# Patient Record
Sex: Male | Born: 1950
Health system: Southern US, Community
[De-identification: ages and names within clinical notes are randomized; demographics above are authoritative.]

## PROBLEM LIST (undated history)

## (undated) DIAGNOSIS — E119 Type 2 diabetes mellitus without complications: Secondary | ICD-10-CM

## (undated) DIAGNOSIS — I10 Essential (primary) hypertension: Secondary | ICD-10-CM

## (undated) DIAGNOSIS — M549 Dorsalgia, unspecified: Secondary | ICD-10-CM

## (undated) DIAGNOSIS — E785 Hyperlipidemia, unspecified: Secondary | ICD-10-CM

## (undated) DIAGNOSIS — H269 Unspecified cataract: Secondary | ICD-10-CM

## (undated) HISTORY — PX: MINOR CARPAL TUNNEL: SHX6472

## (undated) HISTORY — PX: EYE SURGERY: SHX253

## (undated) HISTORY — DX: Unspecified cataract: H26.9

## (undated) HISTORY — DX: Type 2 diabetes mellitus without complications: E11.9

## (undated) HISTORY — DX: Hyperlipidemia, unspecified: E78.5

## (undated) HISTORY — PX: POLYPECTOMY: SHX149

## (undated) HISTORY — DX: Dorsalgia, unspecified: M54.9

## (undated) HISTORY — PX: KNEE SURGERY: SHX244

## (undated) HISTORY — DX: Essential (primary) hypertension: I10

## (undated) HISTORY — PX: TONSILLECTOMY: SUR1361

---

## 1998-03-02 ENCOUNTER — Encounter: Admission: RE | Admit: 1998-03-02 | Discharge: 1998-05-31 | Payer: Self-pay

## 1998-10-10 ENCOUNTER — Encounter: Admission: RE | Admit: 1998-10-10 | Discharge: 1999-01-08 | Payer: Self-pay | Admitting: Orthopaedic Surgery

## 1999-05-04 ENCOUNTER — Emergency Department (HOSPITAL_COMMUNITY): Admission: EM | Admit: 1999-05-04 | Discharge: 1999-05-04 | Payer: Self-pay | Admitting: Emergency Medicine

## 1999-05-11 ENCOUNTER — Emergency Department (HOSPITAL_COMMUNITY): Admission: EM | Admit: 1999-05-11 | Discharge: 1999-05-11 | Payer: Self-pay | Admitting: Emergency Medicine

## 2002-01-25 ENCOUNTER — Encounter: Payer: Self-pay | Admitting: Family Medicine

## 2002-01-25 ENCOUNTER — Encounter: Admission: RE | Admit: 2002-01-25 | Discharge: 2002-01-25 | Payer: Self-pay | Admitting: Family Medicine

## 2003-02-02 ENCOUNTER — Ambulatory Visit (HOSPITAL_COMMUNITY): Admission: RE | Admit: 2003-02-02 | Discharge: 2003-02-02 | Payer: Self-pay | Admitting: Family Medicine

## 2003-02-02 ENCOUNTER — Encounter: Payer: Self-pay | Admitting: Family Medicine

## 2003-12-07 ENCOUNTER — Ambulatory Visit (HOSPITAL_COMMUNITY): Admission: RE | Admit: 2003-12-07 | Discharge: 2003-12-07 | Payer: Self-pay | Admitting: Family Medicine

## 2005-05-24 ENCOUNTER — Encounter: Admission: RE | Admit: 2005-05-24 | Discharge: 2005-05-24 | Payer: Self-pay | Admitting: Family Medicine

## 2005-05-24 IMAGING — CR DG CHEST 2V
1 series · 1 of 1 positions shown · non-contrast
Comparison: None

CLINICAL DATA: Hypertension

CHEST - 2 VIEW:

[w chest pa]
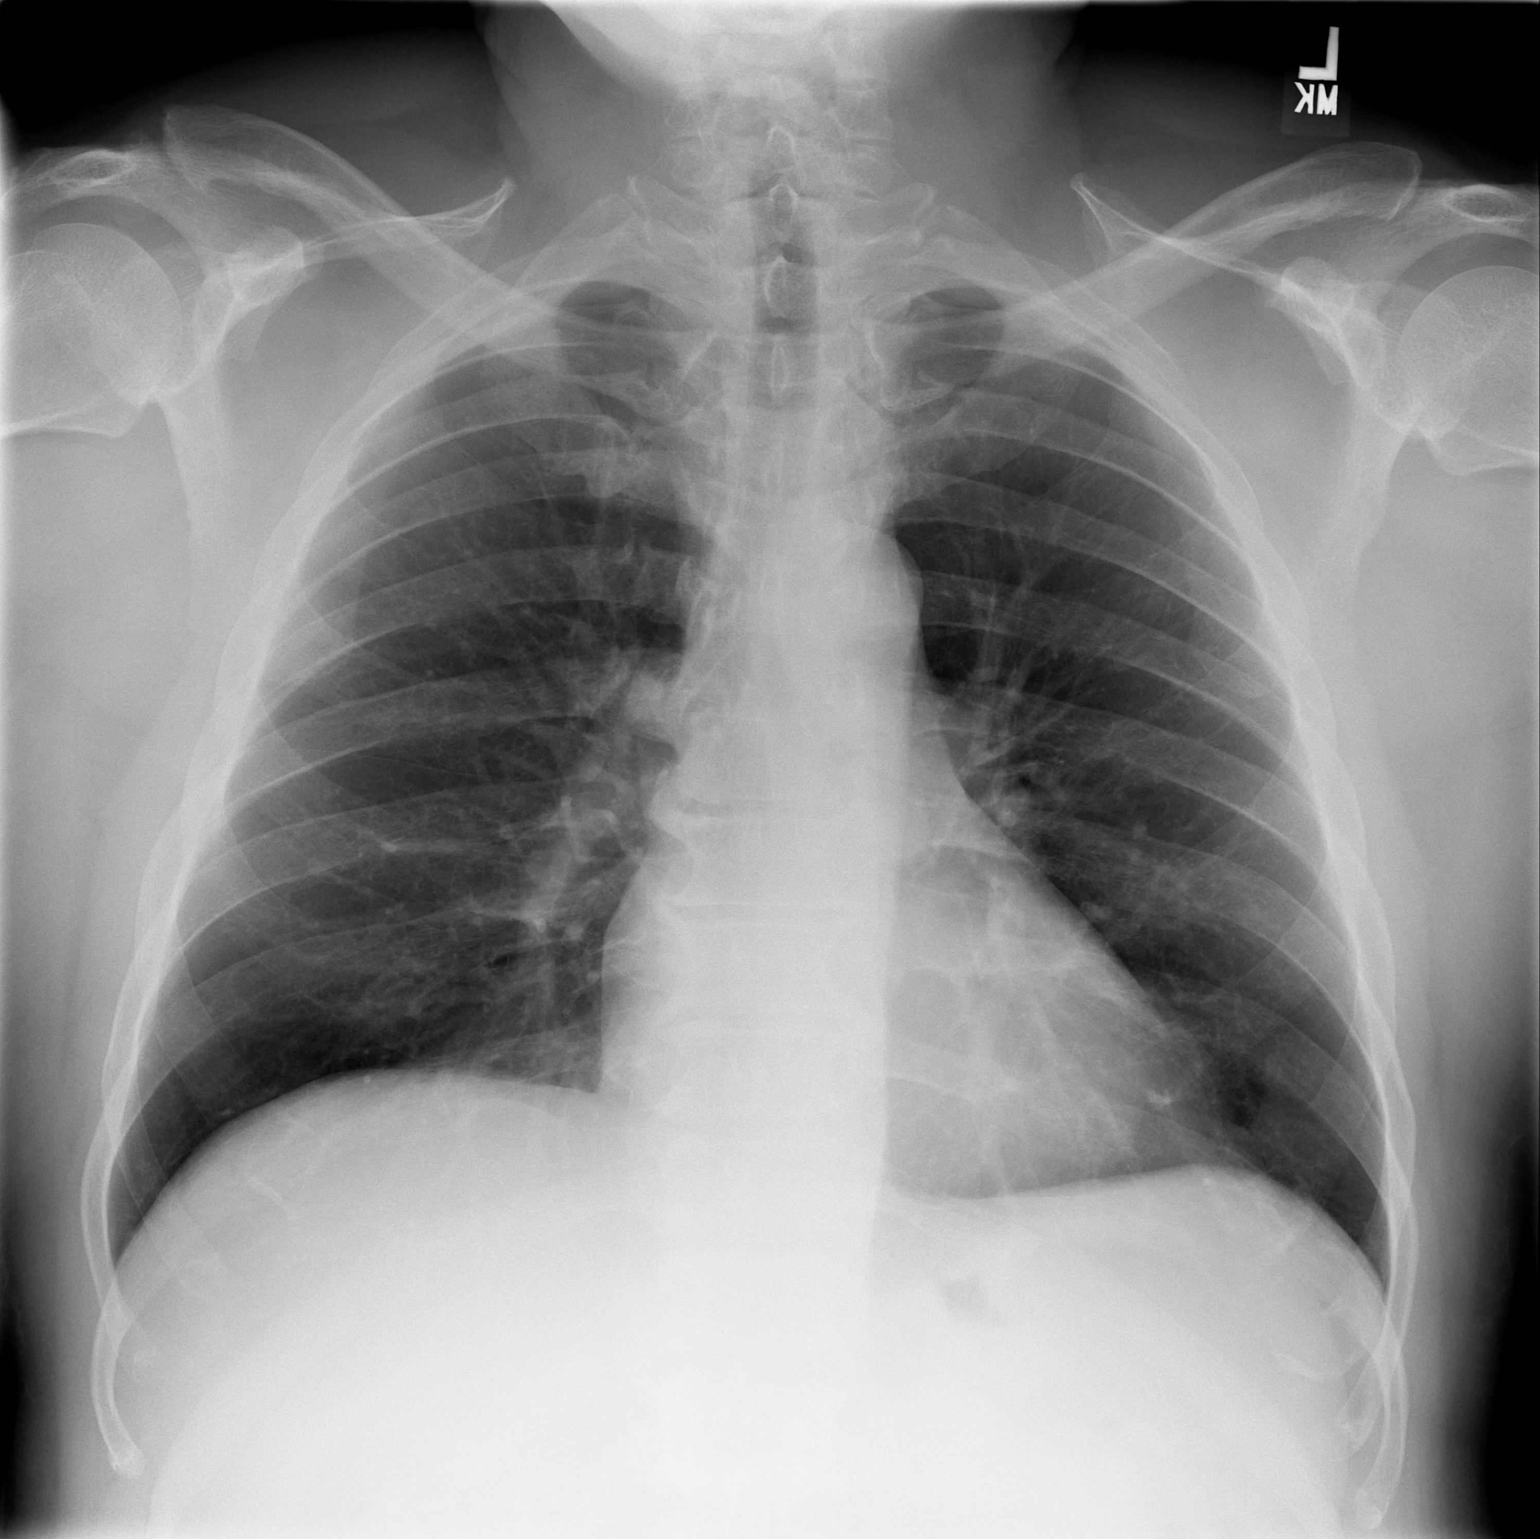

[1 of 1 positions shown; findings below may reference images not displayed]

FINDINGS: The heart size and mediastinal contours are within normal limits. 
Both lungs are clear.  The visualized skeletal structures are unremarkable.
IMPRESSION: No active cardiopulmonary disease

## 2008-10-31 ENCOUNTER — Encounter: Admission: RE | Admit: 2008-10-31 | Discharge: 2008-10-31 | Payer: Self-pay | Admitting: Family Medicine

## 2008-10-31 ENCOUNTER — Ambulatory Visit: Payer: Self-pay | Admitting: Gastroenterology

## 2008-10-31 IMAGING — CR DG CHEST 2V
2 series · 2 of 2 positions shown · non-contrast
Comparison: Chest x-ray of [DATE]

CLINICAL DATA: Hypertension by history

CHEST - 2 VIEW

[w chest pa]
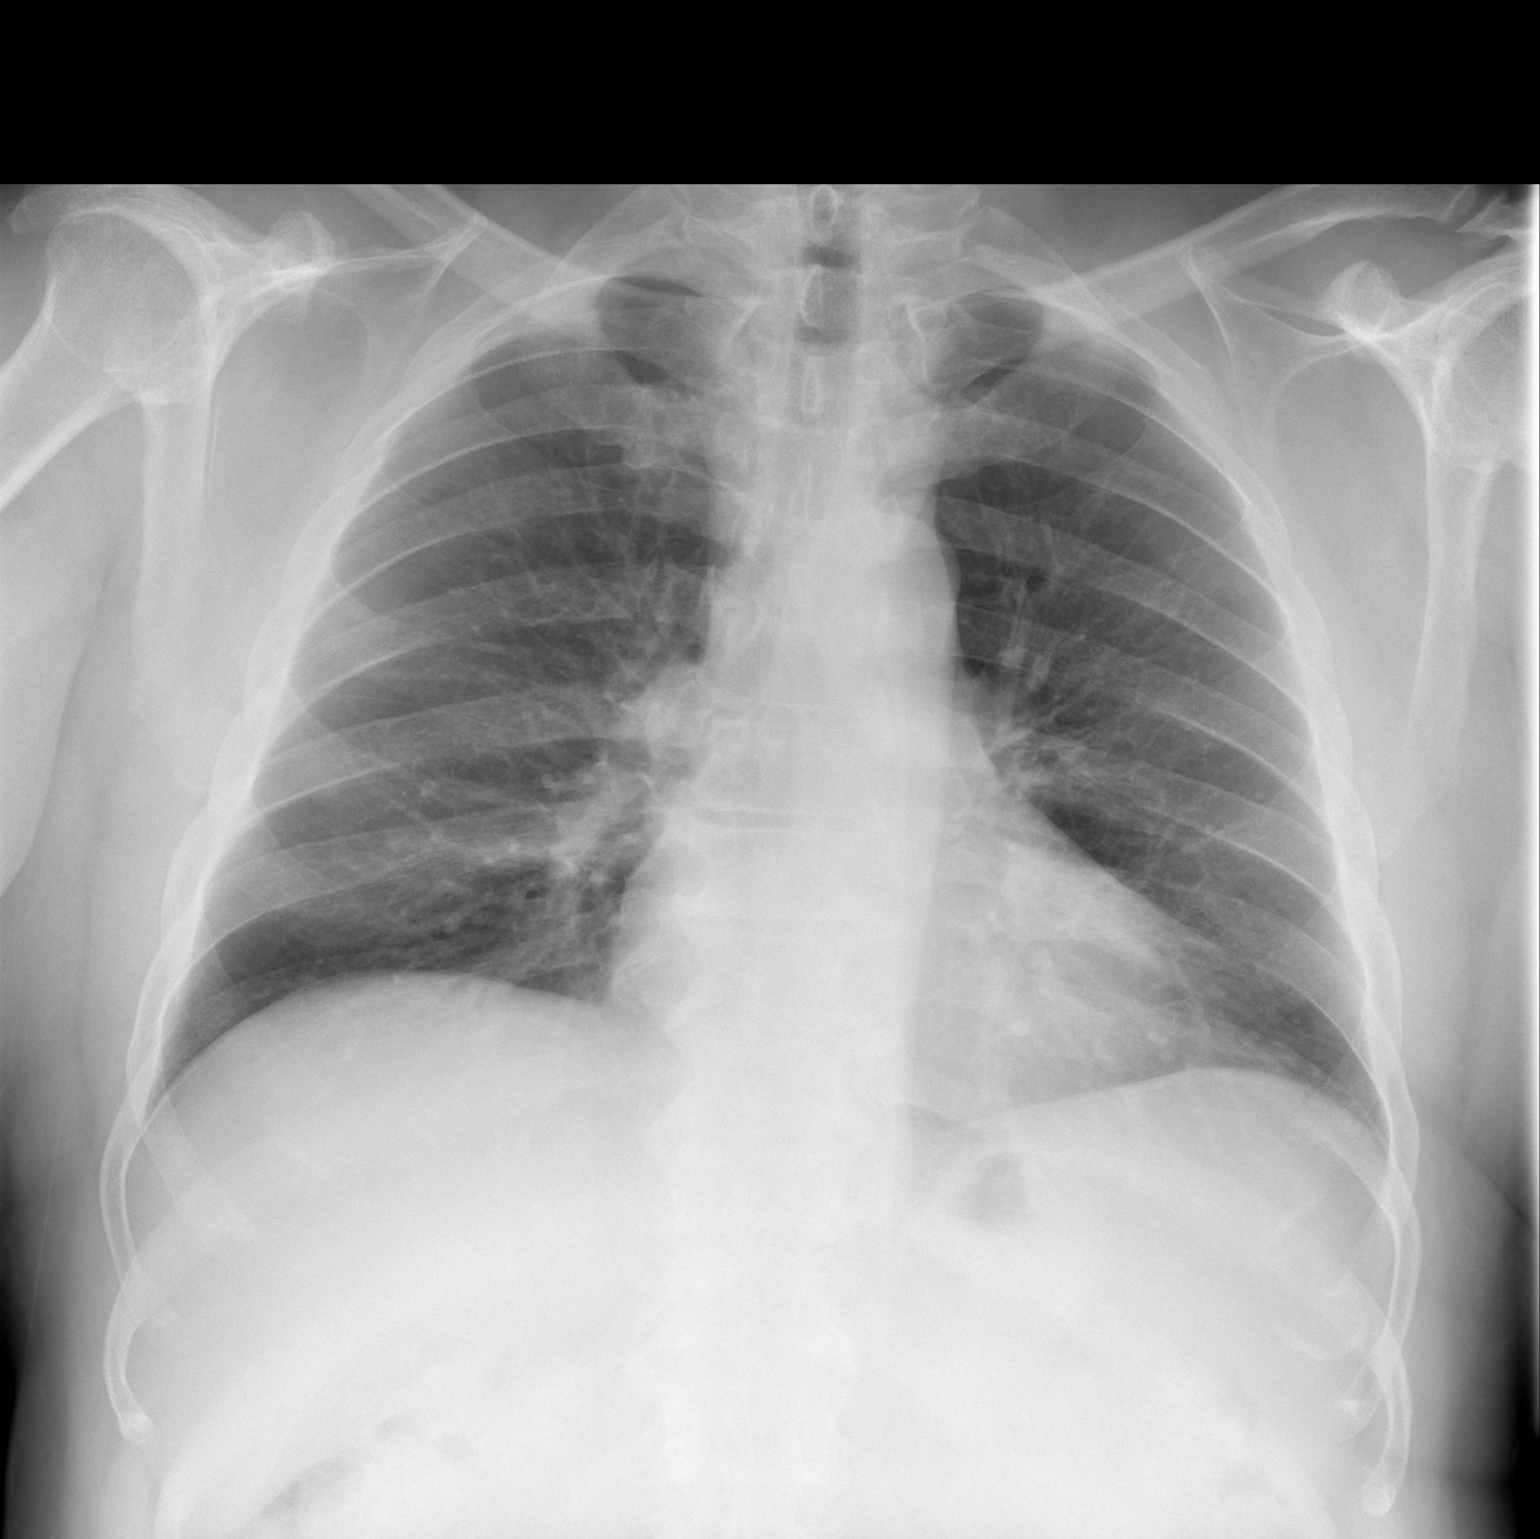

[w chest lat]
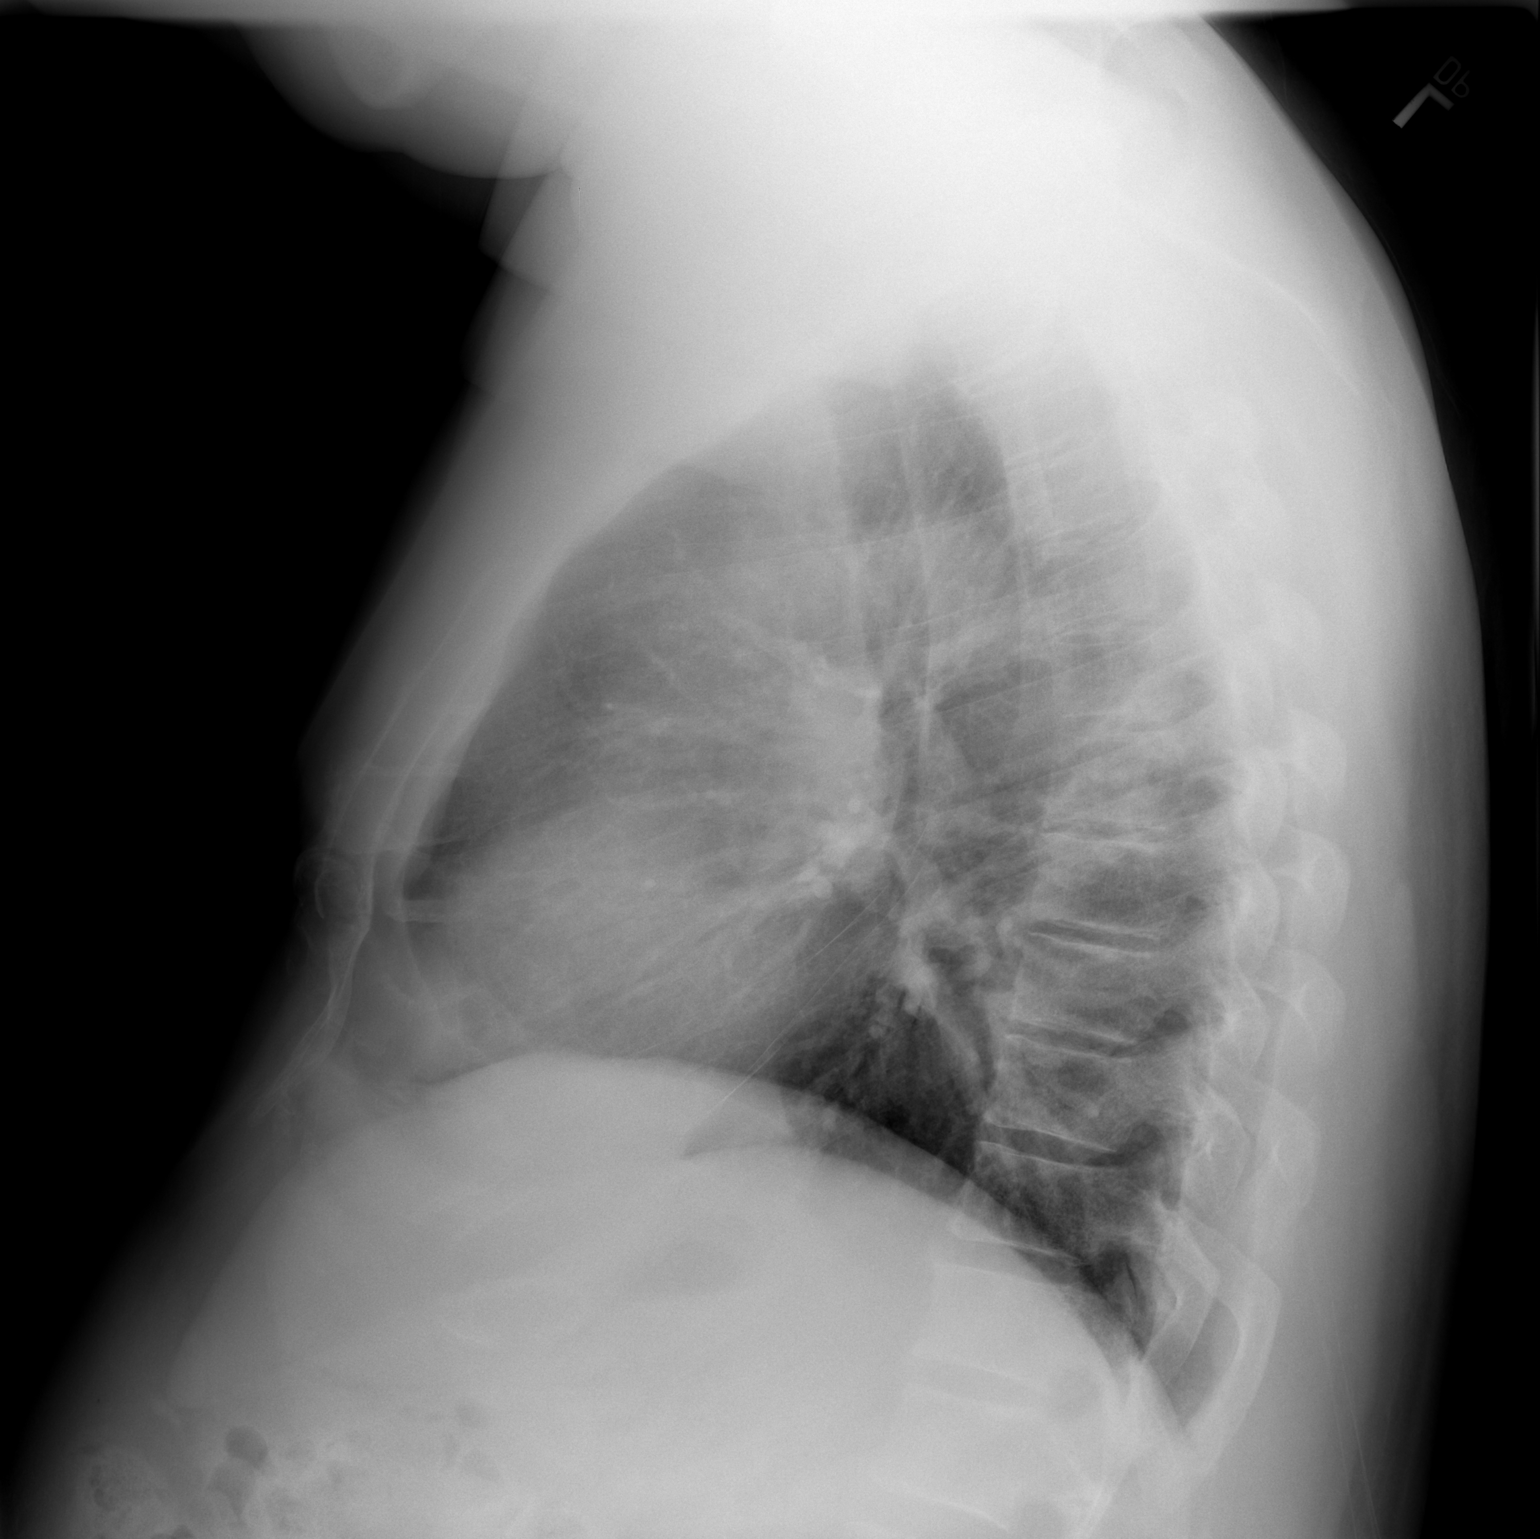

[2 of 2 positions shown; findings below may reference images not displayed]

FINDINGS: The lungs are clear.  The heart is within normal limits
in size.  No bony abnormality is seen.
IMPRESSION: No active lung disease.

## 2008-11-14 ENCOUNTER — Ambulatory Visit: Payer: Self-pay | Admitting: Gastroenterology

## 2008-11-14 ENCOUNTER — Encounter: Payer: Self-pay | Admitting: Gastroenterology

## 2008-11-15 ENCOUNTER — Encounter: Payer: Self-pay | Admitting: Gastroenterology

## 2009-03-10 ENCOUNTER — Emergency Department (HOSPITAL_COMMUNITY): Admission: EM | Admit: 2009-03-10 | Discharge: 2009-03-10 | Payer: Self-pay | Admitting: Emergency Medicine

## 2009-03-10 IMAGING — CT CT CERVICAL SPINE W/O CM
3 of 4 series · 13 of 27 positions shown, 15 images · non-contrast
Comparison: None

CT HEAD

CLINICAL DATA: Status post trauma.  Pain  head trauma.  Now with
neck pain and severe base of skull pain.

CT HEAD WITHOUT CONTRAST
CT CERVICAL SPINE WITHOUT CONTRAST
TECHNIQUE: Multidetector CT imaging of the head and cervical spine
was performed following the standard protocol without intravenous
contrast.  Multiplanar CT image reconstructions of the cervical
spine were also generated.

[Series 6: c_spine 2.0 b31s detail · axial · 0.25mm/px · z∈[+1236,+1358]mm · 4 of 103 slices shown, 5 images]
[im 21/103  soft-tissue]
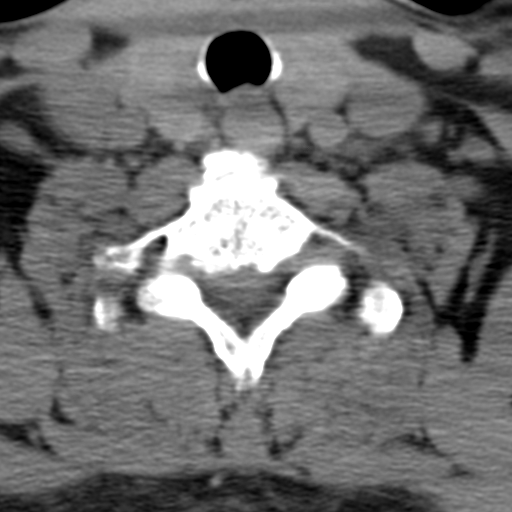
[im 21/103  bone]
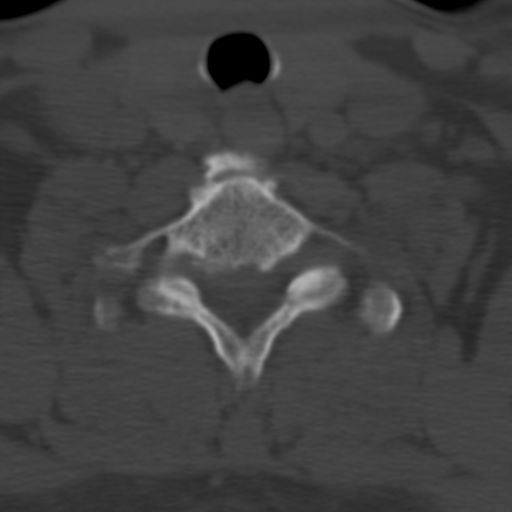
[im 41/103  bone]
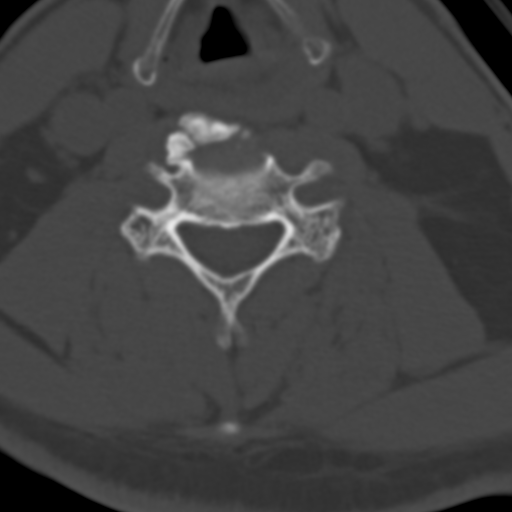
[im 62/103  bone]
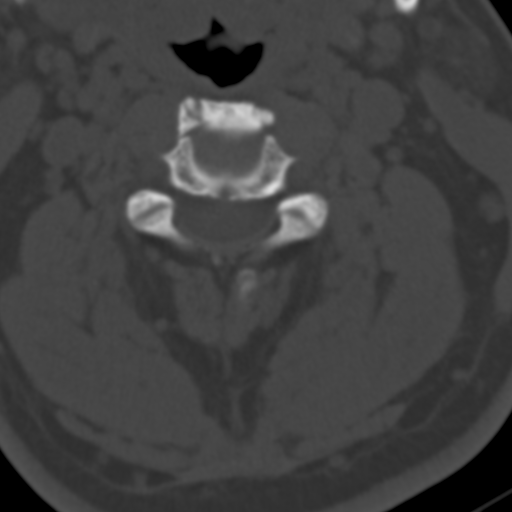
[im 82/103  bone]
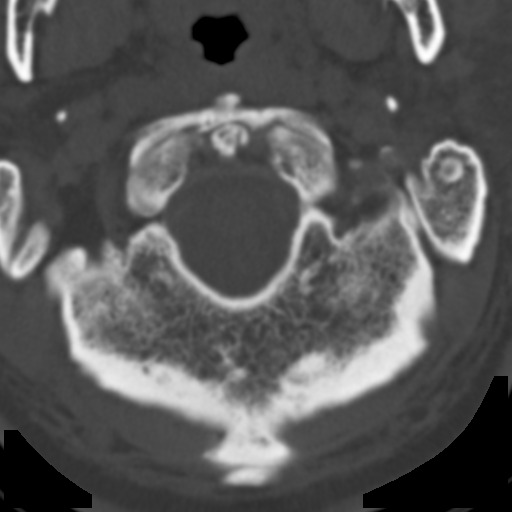

[Series 10: c_spine 2.0 spo sag thins · sagittal · 0.43mm/px · 5 of 47 slices shown, 6 images]
[im 16/47  bone]
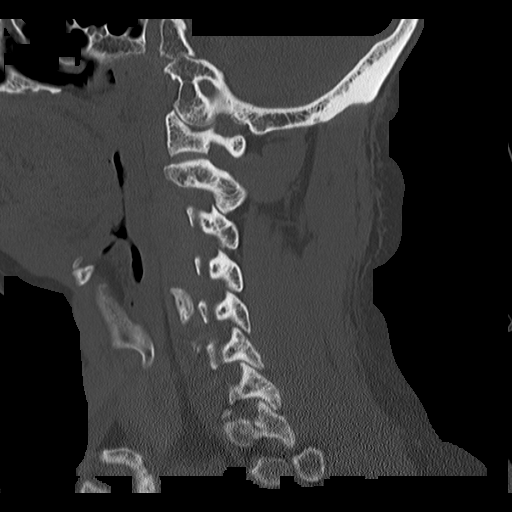
[im 20/47  bone]
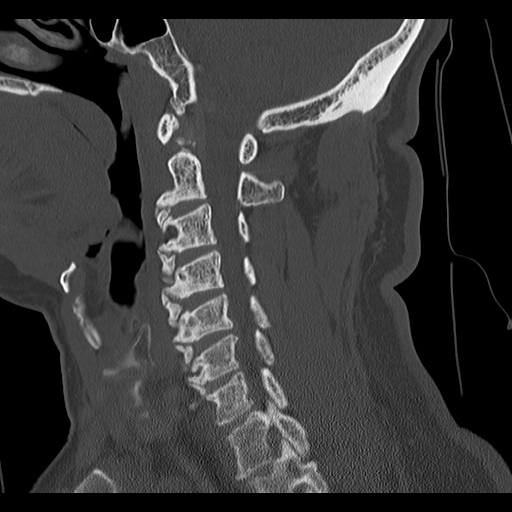
[im 24/47  soft-tissue]
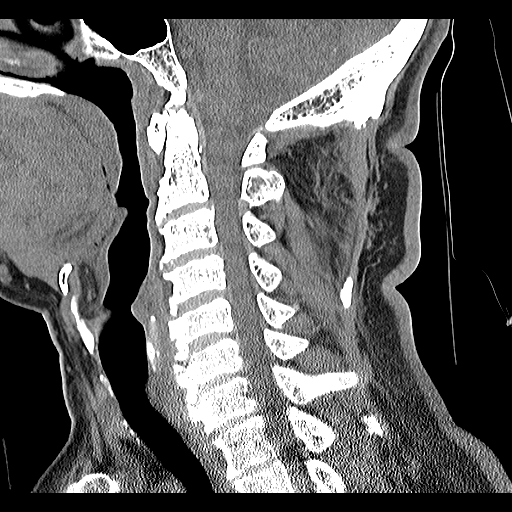
[im 24/47  bone]
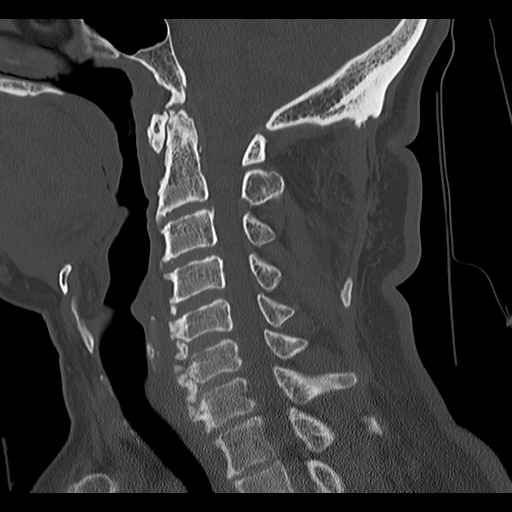
[im 27/47  bone]
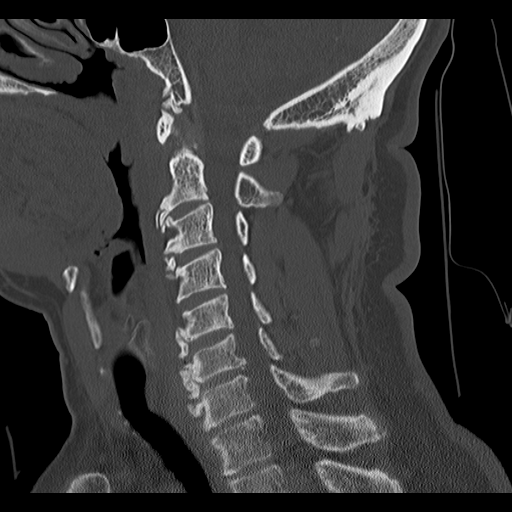
[im 31/47  bone]
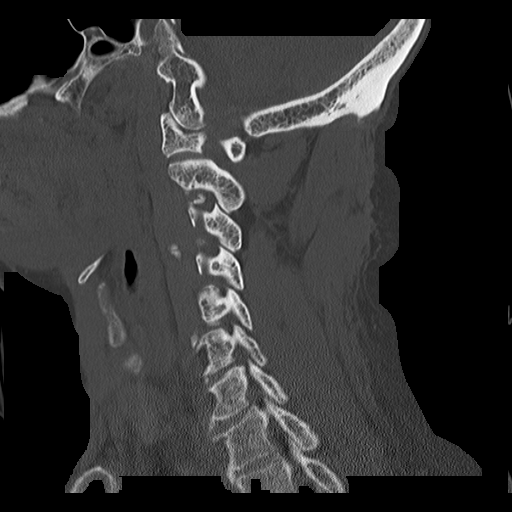

[Series 602: <mpr thick orthog · axial · 0.24mm/px · z∈[+1224,+1335]mm · 4 of 93 slices shown]
[im 19/93  bone]
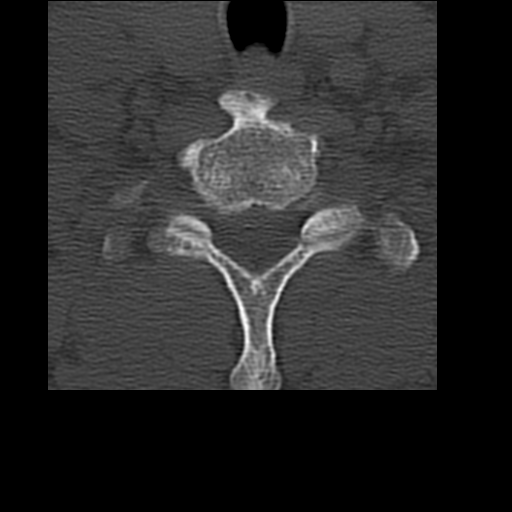
[im 37/93  bone]
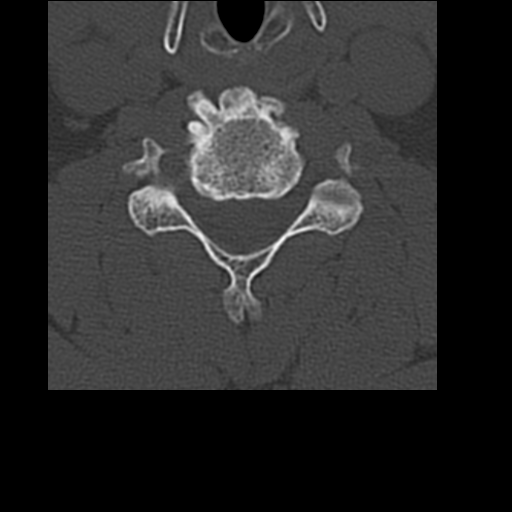
[im 56/93  bone]
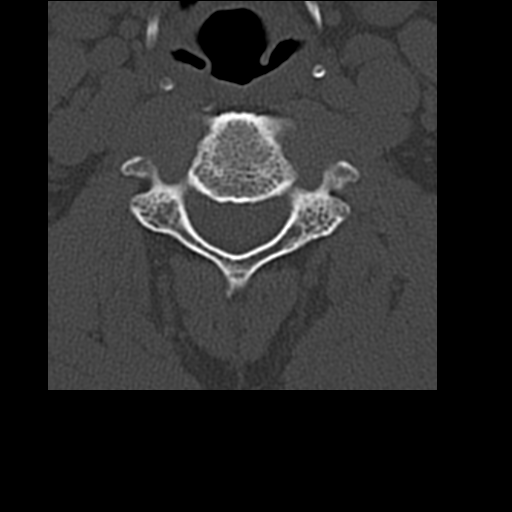
[im 74/93  bone]
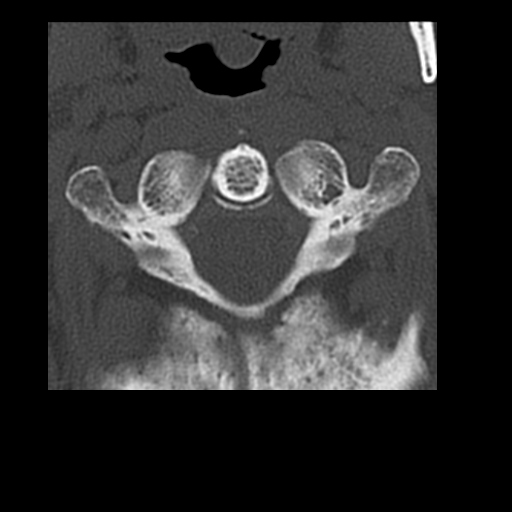

[13 of 27 positions shown; findings below may reference images not displayed]

FINDINGS: There is diffuse patchy low density throughout the
subcortical and periventricular white matter consistent with
chronic small vessel ischemic change.

There is no evidence for acute brain infarct, hemorrhage or mass.

There is a calcification identified within the left side of the
cerebellum.

The mastoid air cells and the paranasal sinuses are normally
aerated.

The skull appears intact.
IMPRESSION: 1.  No acute intracranial abnormalities.

CT CERVICAL SPINE
FINDINGS: The alignment of the cervical spine is normal.

The vertebral body heights are well preserved.

The facet joints appear well aligned.

There is bulky anterior syndesmophyte formation with normal
appearing disc spaces.

The prevertebral soft tissue space appears normal.
IMPRESSION: 1.  No acute findings.

2.  Bulky anterior syndesmophytes with normal appearing disc
spaces.  This can be seen with "DISH" or diffuse idiopathic
skeletal hyperostosis.

## 2009-03-21 ENCOUNTER — Encounter: Admission: RE | Admit: 2009-03-21 | Discharge: 2009-06-19 | Payer: Self-pay | Admitting: Family Medicine

## 2009-06-01 ENCOUNTER — Encounter: Admission: RE | Admit: 2009-06-01 | Discharge: 2009-06-01 | Payer: Self-pay | Admitting: Neurology

## 2009-06-01 IMAGING — CR DG CHEST W/ LORDOTIC
3 series · 3 of 3 positions shown · non-contrast
Comparison: [HOSPITAL] at [REDACTED] [HOSPITAL] chest x-ray
[DATE].

CLINICAL DATA: Right arm pain - brachial plexopathy.  Former
smoker, hypertension, no chest complaints.  Assessment for thoracic
inlet abnormality and cervical ribs.

CHEST - 1 VIEW WITH LORDOTIC VIEW(S)

[view not recorded (1 of 3)]
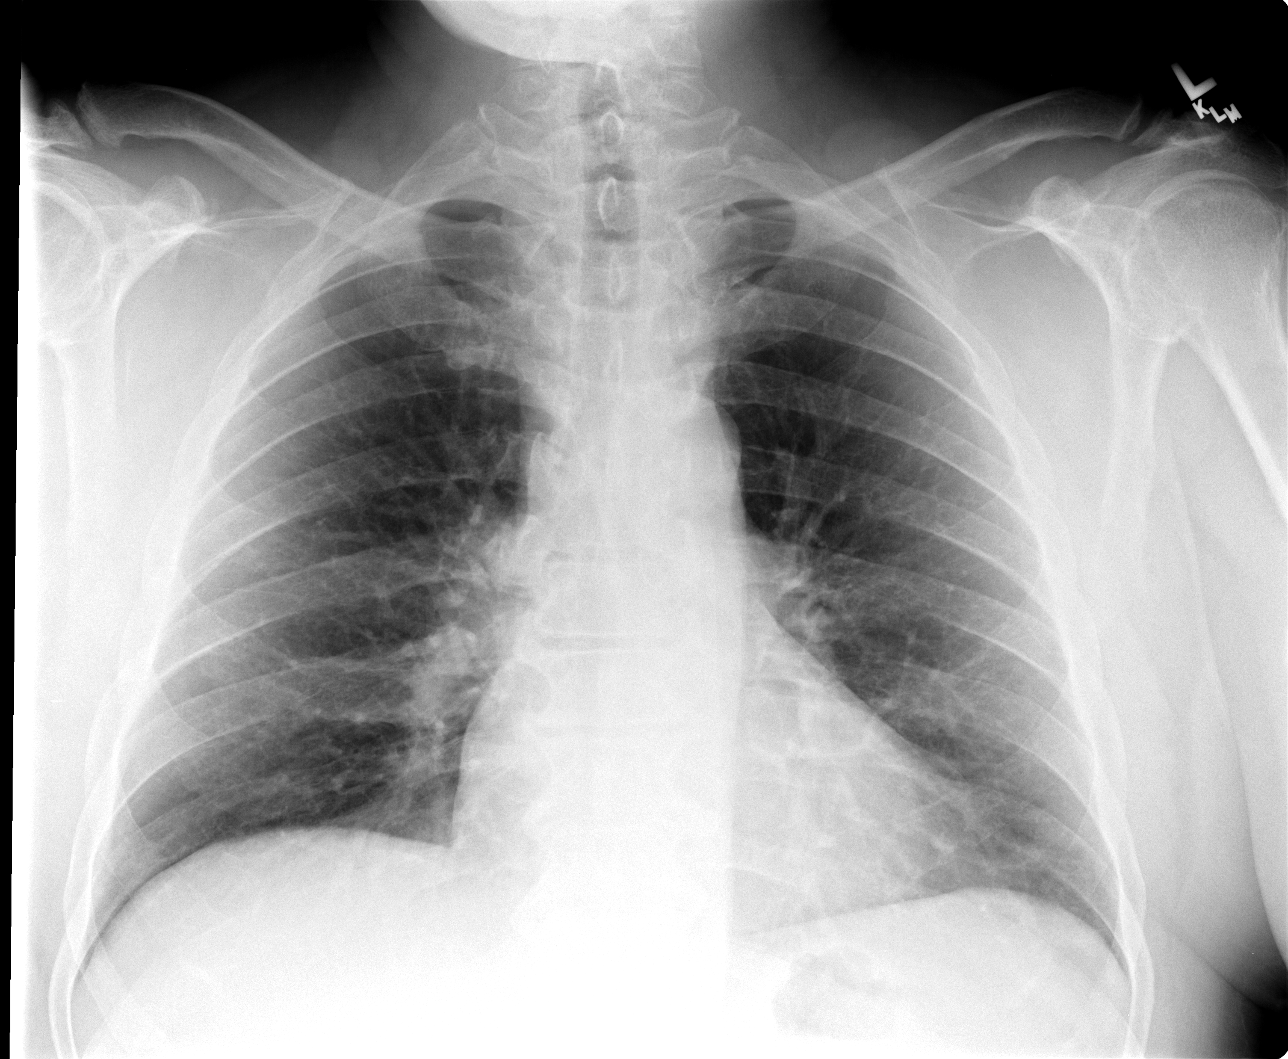

[view not recorded (2 of 3)]
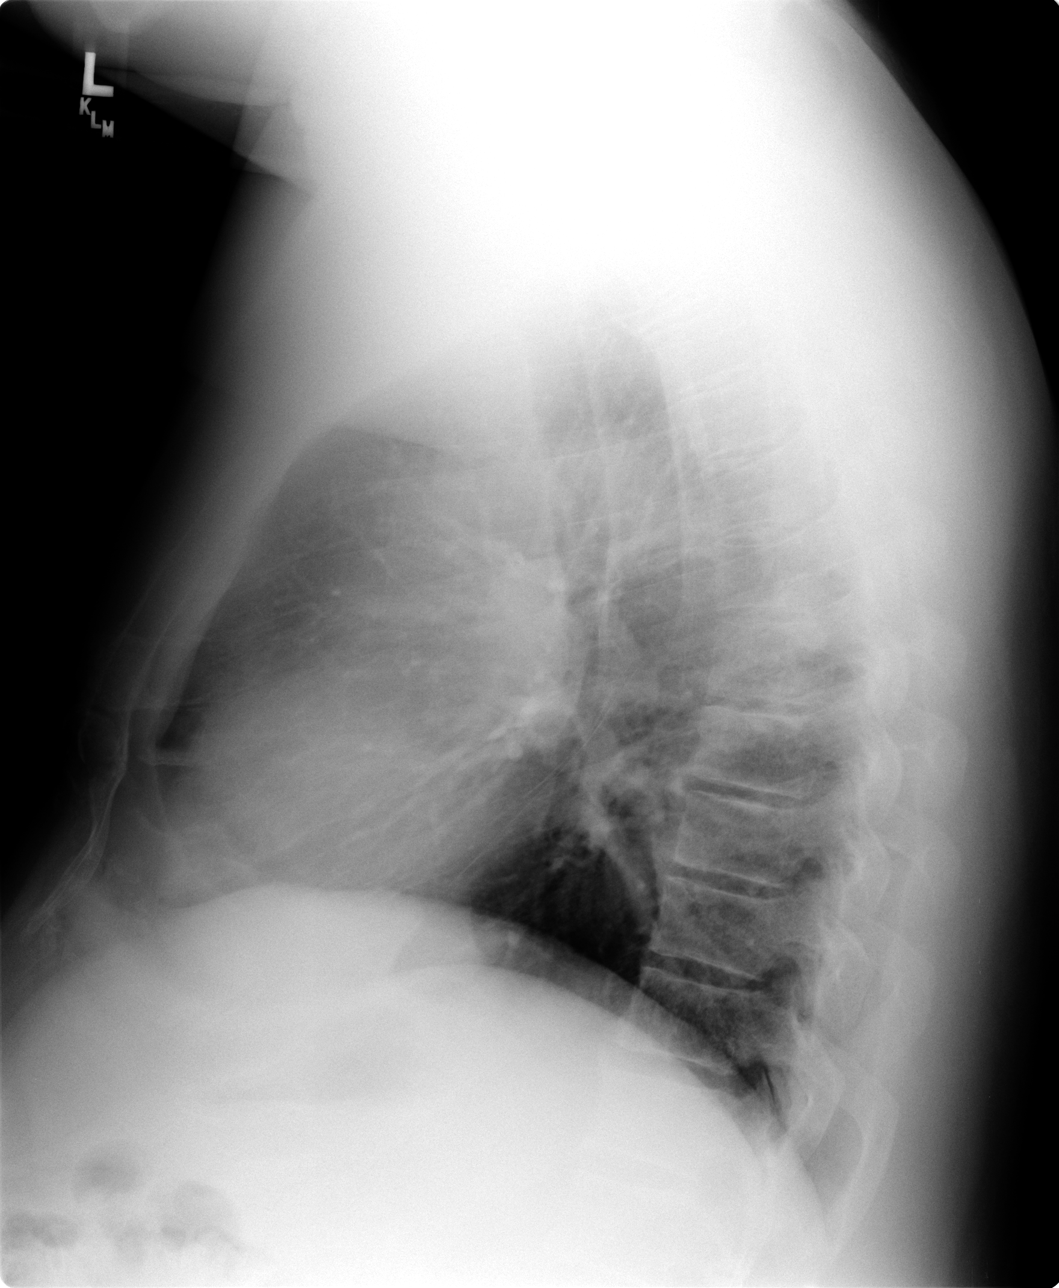

[view not recorded (3 of 3)]
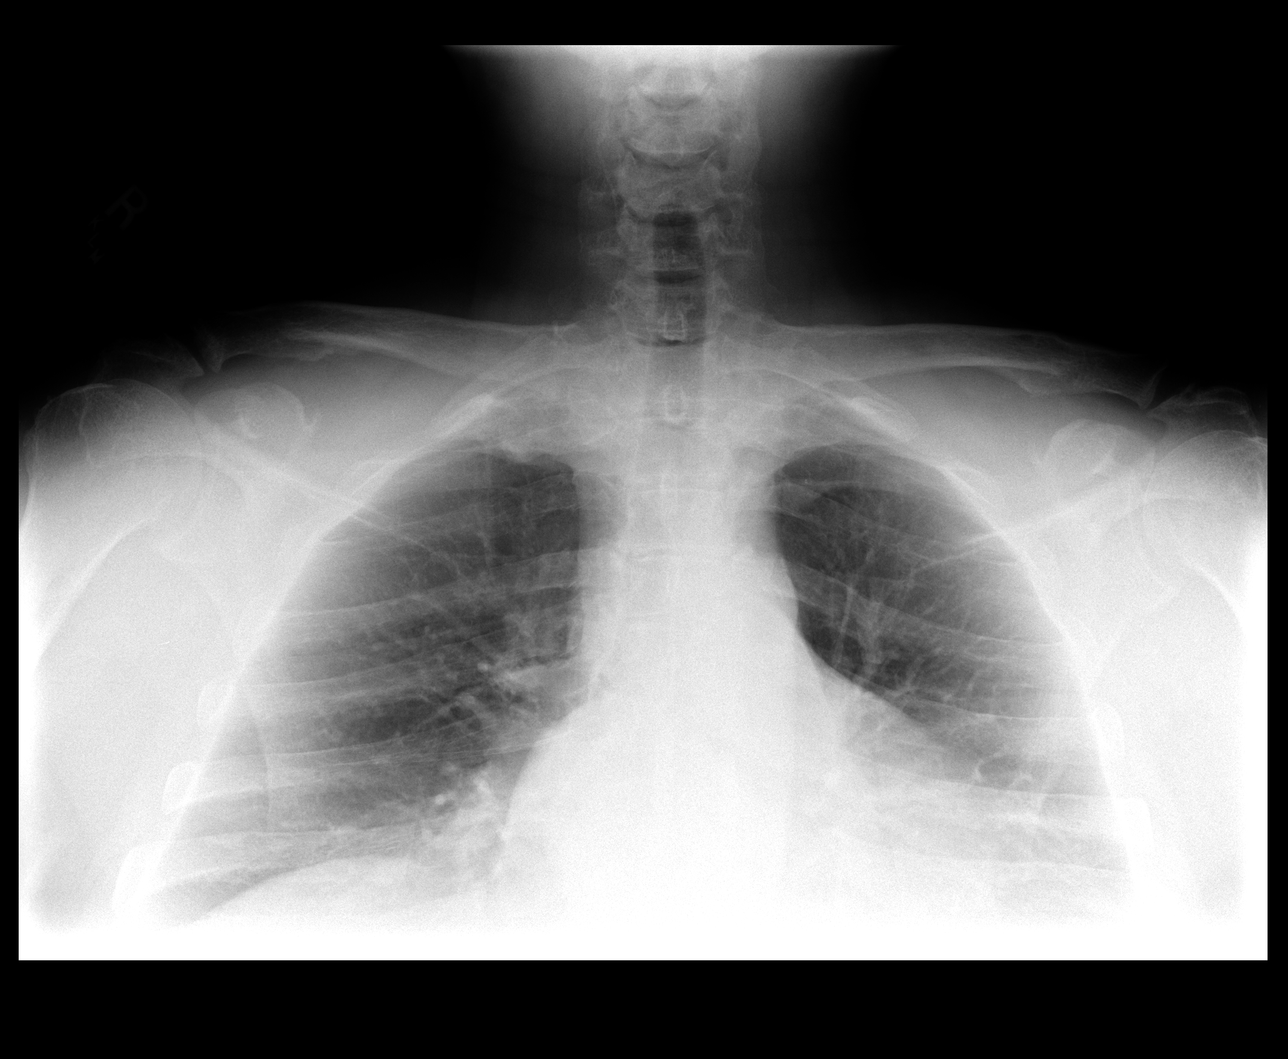

[3 of 3 positions shown; findings below may reference images not displayed]

FINDINGS: Apical lordotic demonstrates no osseous, pleural, or lung
parenchymal abnormality.  Lungs remain clear.  Heart size and
configuration remain normal.  Mediastinum, hila, pleura, slight
degenerative changes dorsal spine are stable.  No cervical ribs
seen.
IMPRESSION: Stable.  No active cardiopulmonary disease.

## 2009-10-03 ENCOUNTER — Ambulatory Visit (HOSPITAL_COMMUNITY): Admission: RE | Admit: 2009-10-03 | Discharge: 2009-10-03 | Payer: Self-pay | Admitting: Family Medicine

## 2009-10-03 IMAGING — CR DG CHEST 2V
2 series · 2 of 2 positions shown · non-contrast
Comparison: [DATE]

CLINICAL DATA: Hypertension

CHEST - 2 VIEW

[w chest pa]
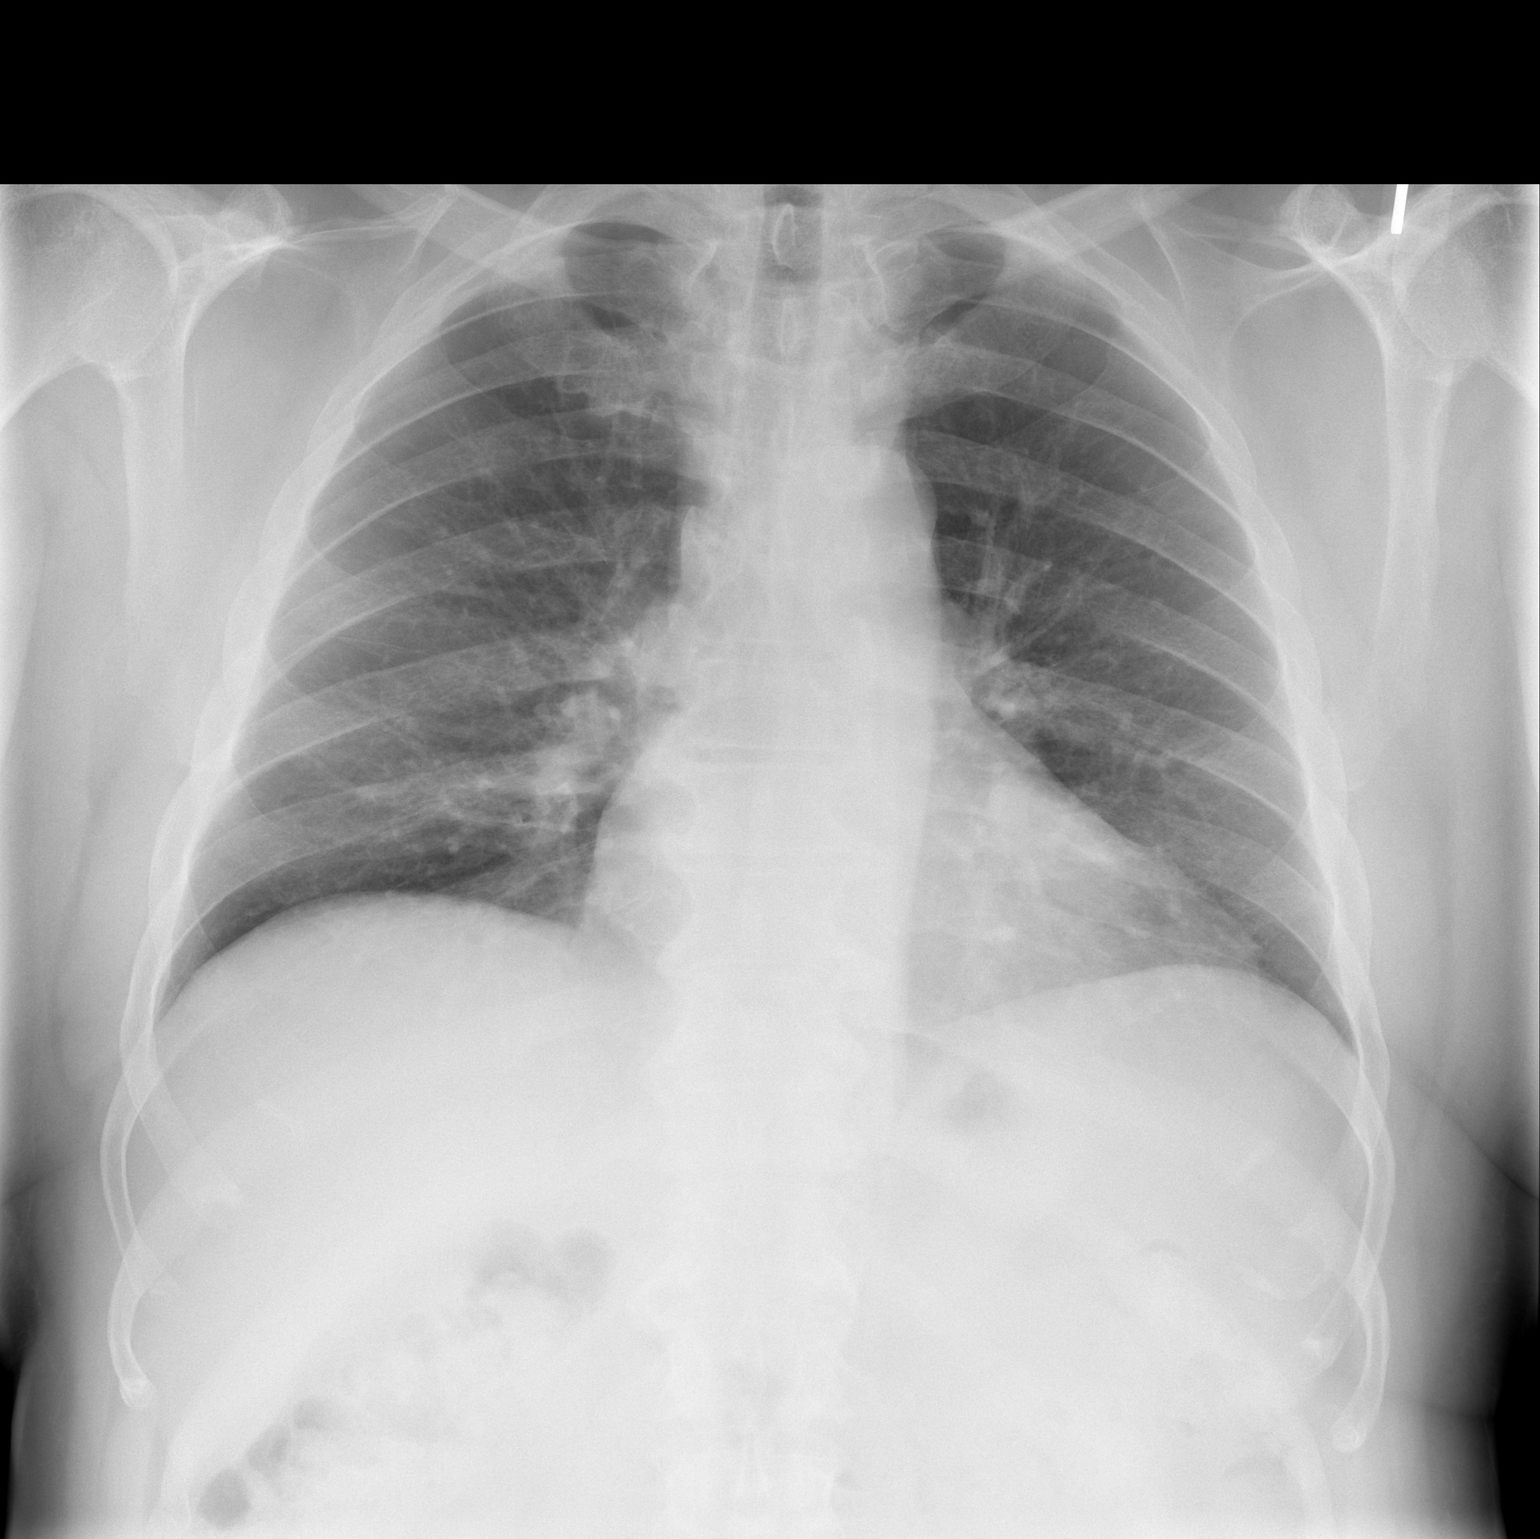

[w chest lat]
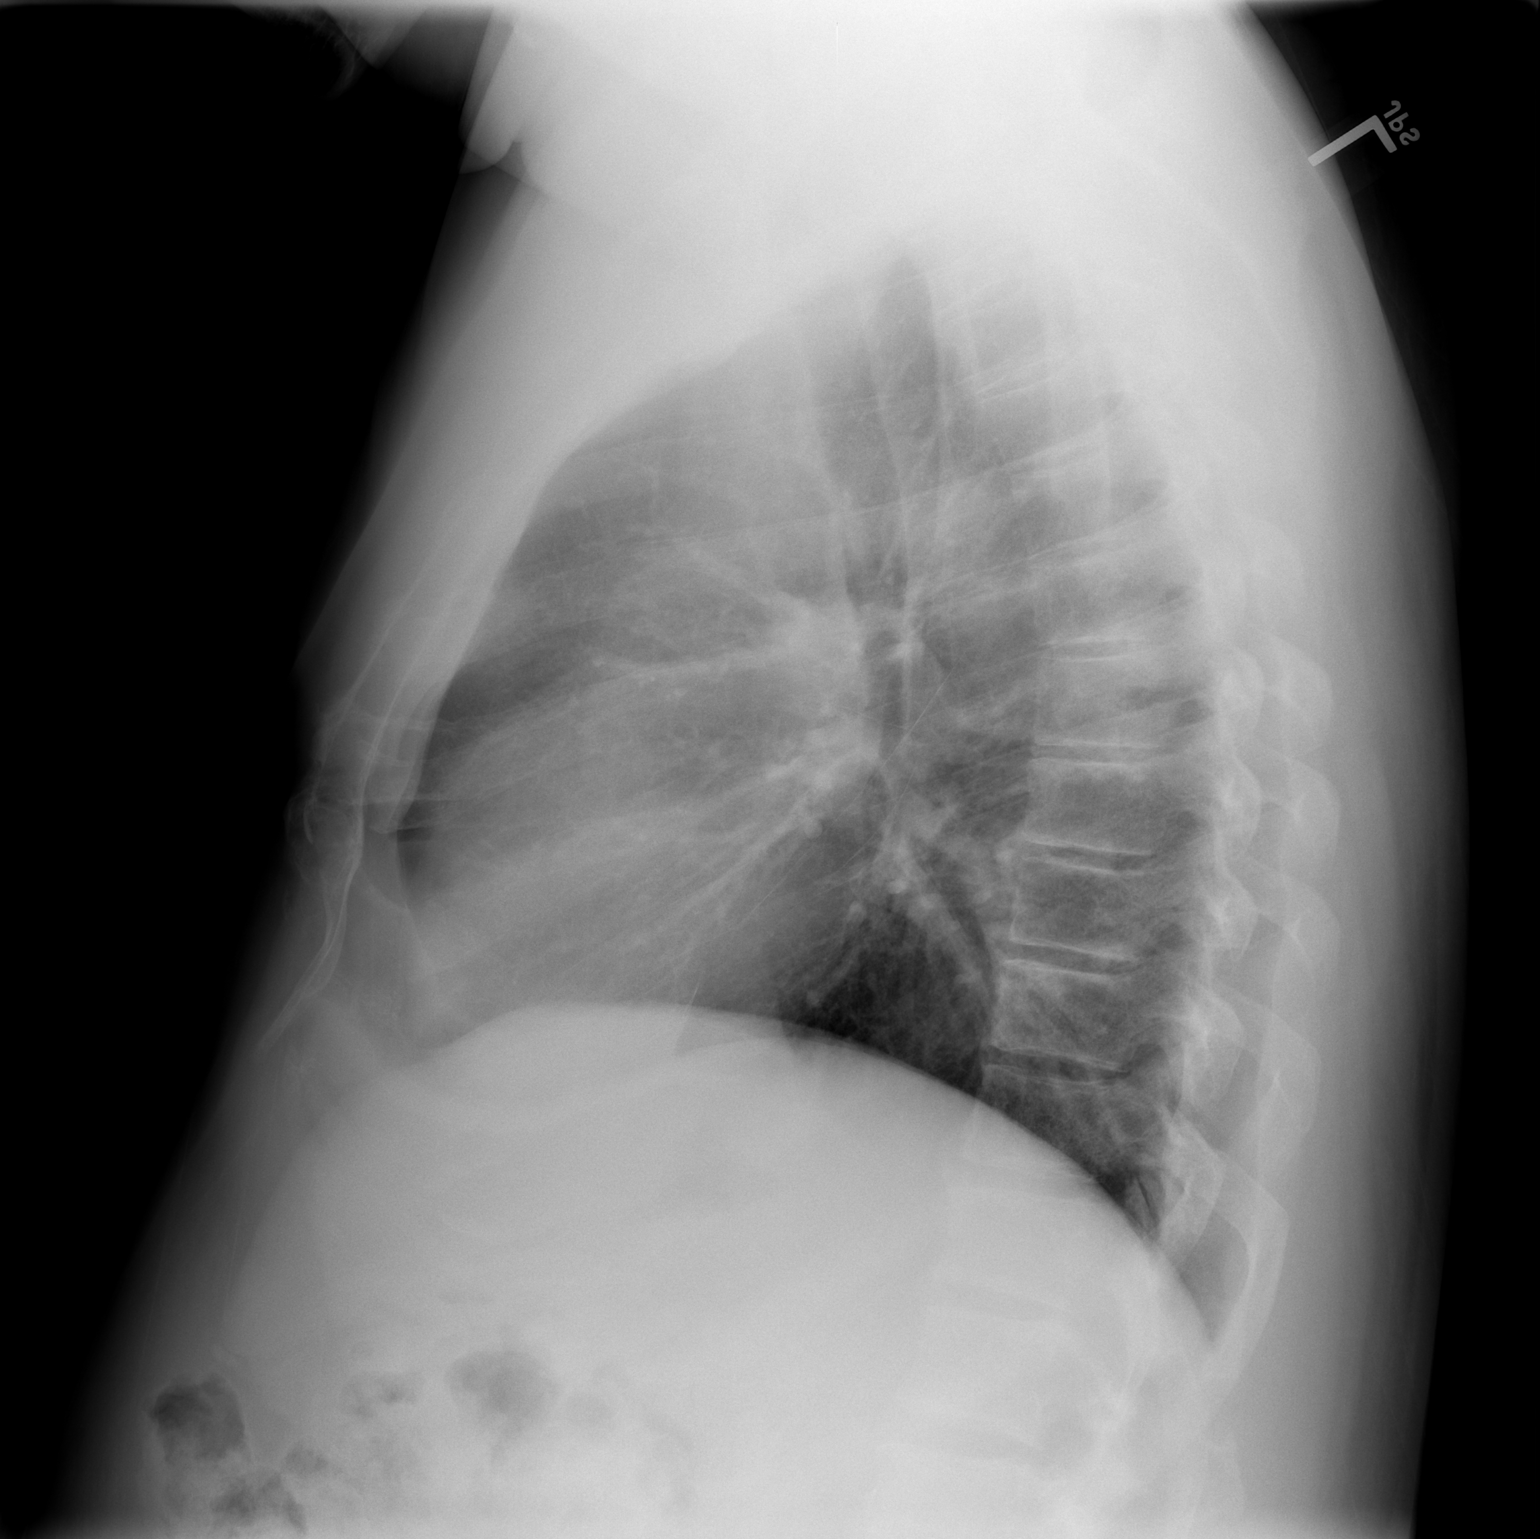

[2 of 2 positions shown; findings below may reference images not displayed]

FINDINGS: Cardiomediastinal silhouette is stable.  No acute
infiltrate or pleural effusion.  No pulmonary edema.  Mild
degenerative changes lower thoracic spine.
IMPRESSION: No active disease.  No significant change.

## 2011-01-14 LAB — CBC
HCT: 45.6 % (ref 39.0–52.0)
Hemoglobin: 15.3 g/dL (ref 13.0–17.0)
MCHC: 33.5 g/dL (ref 30.0–36.0)
RDW: 12.6 % (ref 11.5–15.5)

## 2011-01-14 LAB — COMPREHENSIVE METABOLIC PANEL
BUN: 5 mg/dL — ABNORMAL LOW (ref 6–23)
Calcium: 9.3 mg/dL (ref 8.4–10.5)
Glucose, Bld: 89 mg/dL (ref 70–99)
Total Protein: 7.8 g/dL (ref 6.0–8.3)

## 2011-01-14 LAB — LIPID PANEL
Cholesterol: 126 mg/dL (ref 0–200)
HDL: 38 mg/dL — ABNORMAL LOW (ref >39)
LDL Cholesterol: 73 mg/dL (ref 0–99)
Total CHOL/HDL Ratio: 3.3 RATIO

## 2011-01-14 LAB — TSH: TSH: 0.909 u[IU]/mL (ref 0.350–4.500)

## 2011-01-30 ENCOUNTER — Ambulatory Visit
Admission: RE | Admit: 2011-01-30 | Discharge: 2011-01-30 | Disposition: A | Discharge status: Home / Self Care | Payer: Self-pay | Source: Ambulatory Visit | Attending: Family Medicine | Admitting: Family Medicine

## 2011-01-30 ENCOUNTER — Other Ambulatory Visit: Payer: Self-pay | Admitting: *Deleted

## 2011-01-30 DIAGNOSIS — M25552 Pain in left hip: Secondary | ICD-10-CM

## 2011-01-30 DIAGNOSIS — IMO0002 Reserved for concepts with insufficient information to code with codable children: Secondary | ICD-10-CM

## 2011-01-30 DIAGNOSIS — M25452 Effusion, left hip: Secondary | ICD-10-CM

## 2011-01-30 IMAGING — CR DG PELVIS 1-2V
1 series · 1 of 1 positions shown · non-contrast
Comparison: None

CLINICAL DATA: left hip and buttock pain

PELVIS - 1-2 VIEW

[t pelvis a.p.]
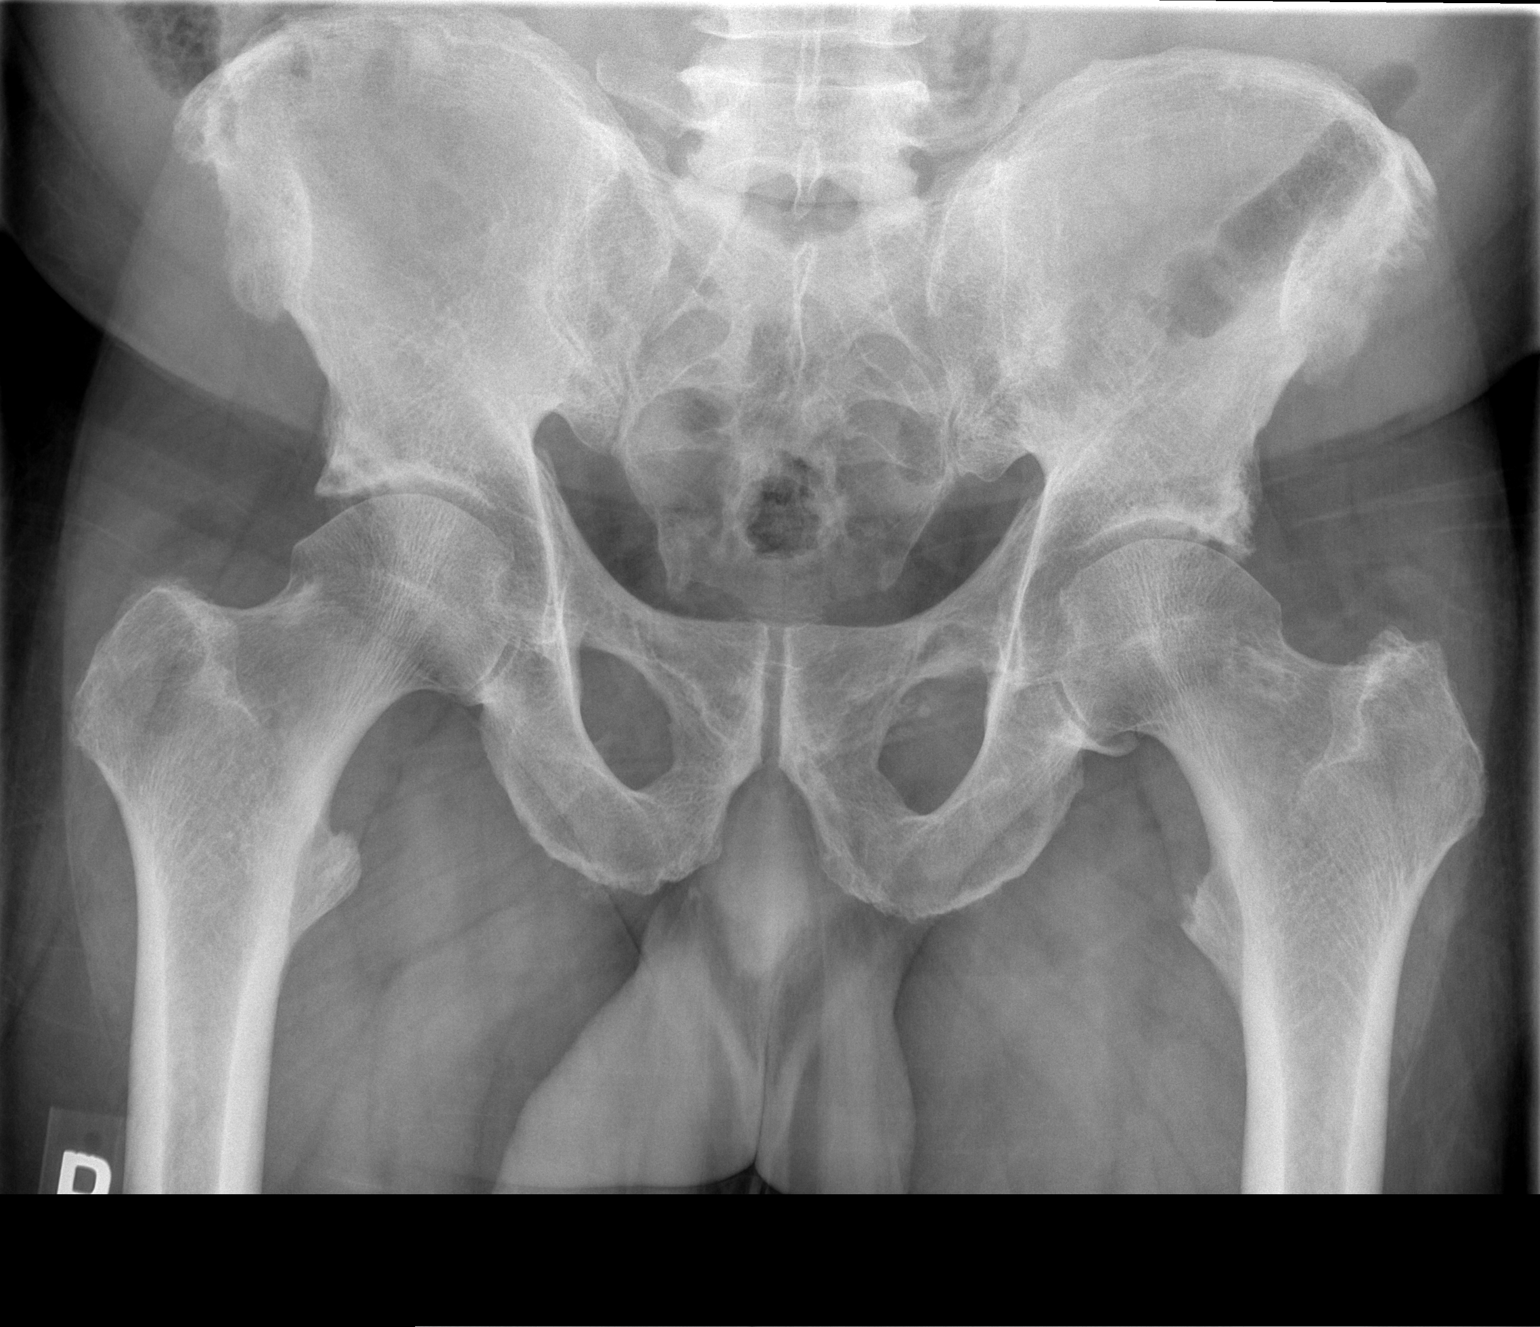

[1 of 1 positions shown; findings below may reference images not displayed]

FINDINGS: There is mild to moderate bilateral hip osteoarthritis.

There is no acute fracture or dislocation identified.

Mild L5-S1 degenerative disc disease is noted.

No radiopaque foreign bodies or soft tissue calcifications.
IMPRESSION: 1.  No acute findings.
2.  Degenerative joint disease

## 2011-02-14 ENCOUNTER — Inpatient Hospital Stay (HOSPITAL_COMMUNITY): Admission: RE | Admit: 2011-02-14 | Payer: Self-pay | Source: Ambulatory Visit | Admitting: Orthopedic Surgery

## 2012-01-20 ENCOUNTER — Ambulatory Visit: Payer: 59

## 2012-01-20 ENCOUNTER — Encounter: Payer: Self-pay | Admitting: Emergency Medicine

## 2012-01-20 ENCOUNTER — Ambulatory Visit (INDEPENDENT_AMBULATORY_CARE_PROVIDER_SITE_OTHER): Payer: 59 | Admitting: Emergency Medicine

## 2012-01-20 DIAGNOSIS — R05 Cough: Secondary | ICD-10-CM

## 2012-01-20 DIAGNOSIS — J189 Pneumonia, unspecified organism: Secondary | ICD-10-CM

## 2012-01-20 DIAGNOSIS — R072 Precordial pain: Secondary | ICD-10-CM

## 2012-01-20 DIAGNOSIS — J101 Influenza due to other identified influenza virus with other respiratory manifestations: Secondary | ICD-10-CM

## 2012-01-20 LAB — POCT CBC
Granulocyte percent: 51.4 %G (ref 37–80)
MCV: 92.3 fL (ref 80–97)
MID (cbc): 0.6 (ref 0–0.9)
POC Granulocyte: 2.8 (ref 2–6.9)
POC LYMPH PERCENT: 37.3 %L (ref 10–50)
Platelet Count, POC: 219 10*3/uL (ref 142–424)
RDW, POC: 13.1 %

## 2012-01-20 LAB — POCT INFLUENZA A/B: Influenza B, POC: POSITIVE

## 2012-01-20 IMAGING — CR DG CHEST 2V
2 series · 2 of 2 positions shown · non-contrast
Comparison: [DATE]

CLINICAL DATA: Cough.

CHEST - 2 VIEW

[PA]
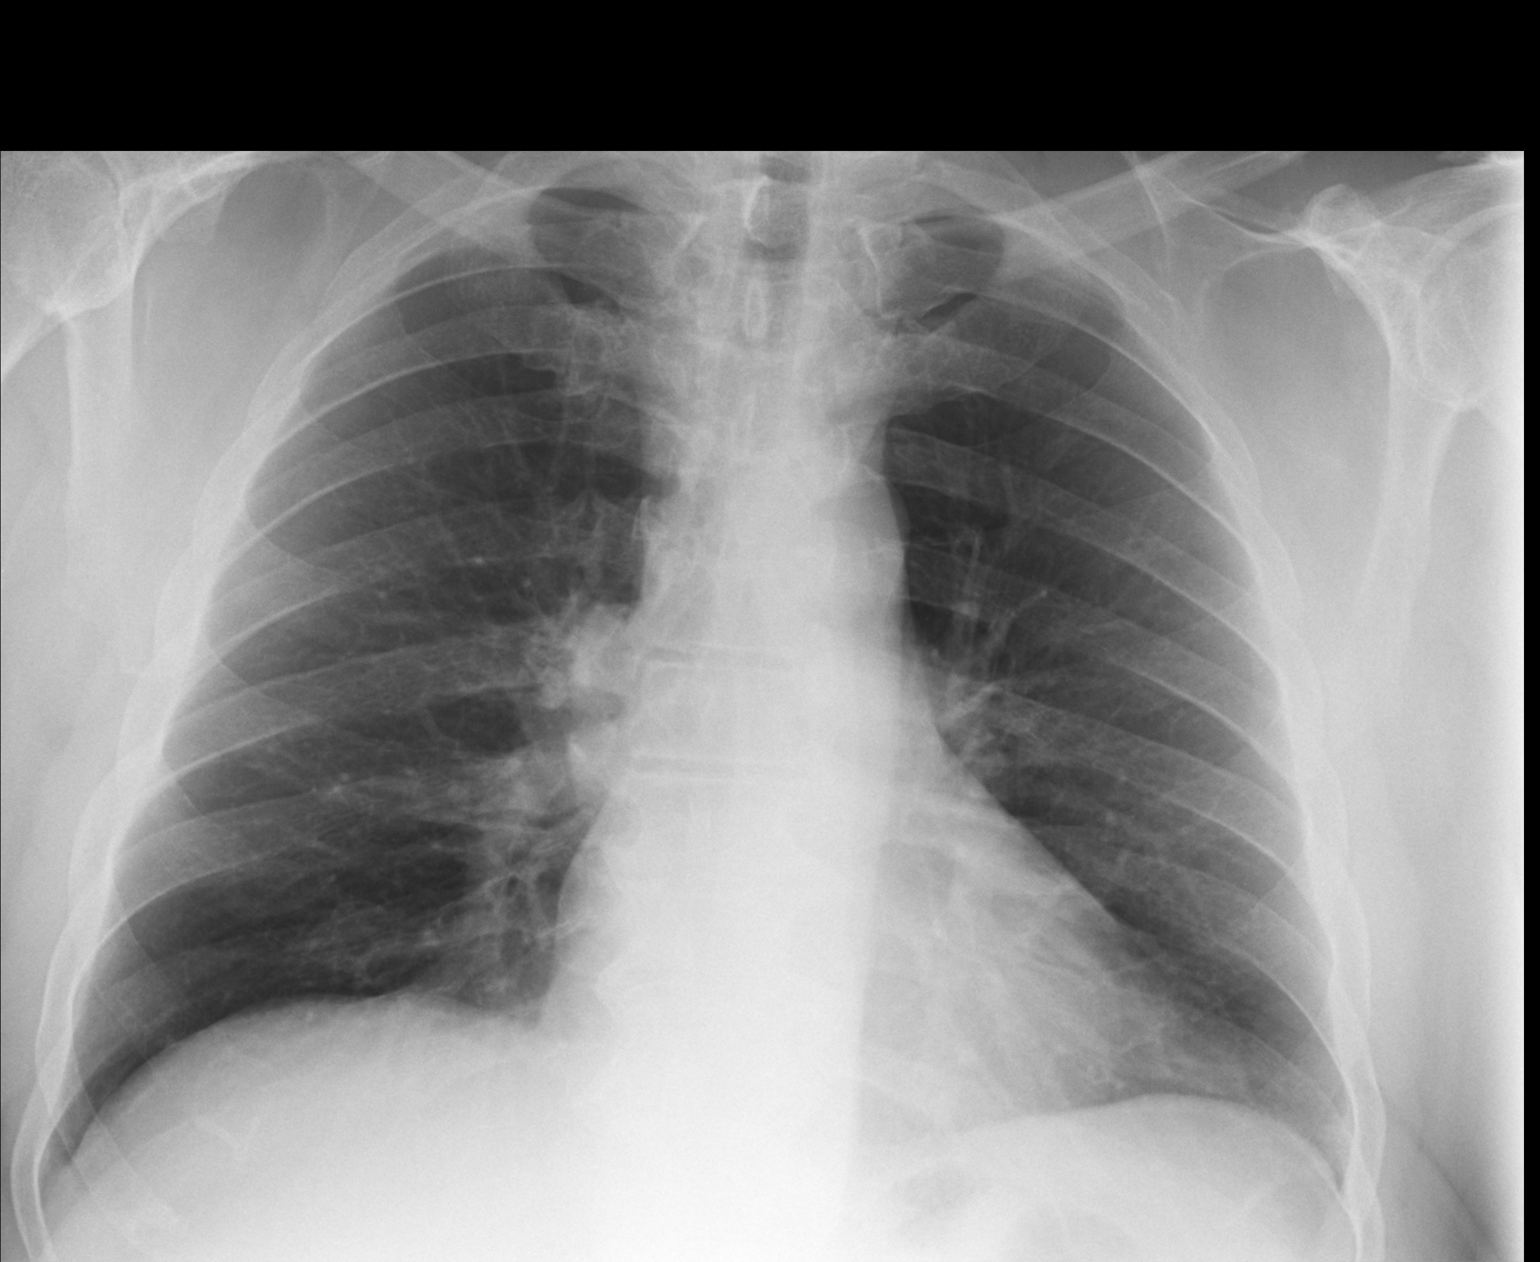

[lateral]
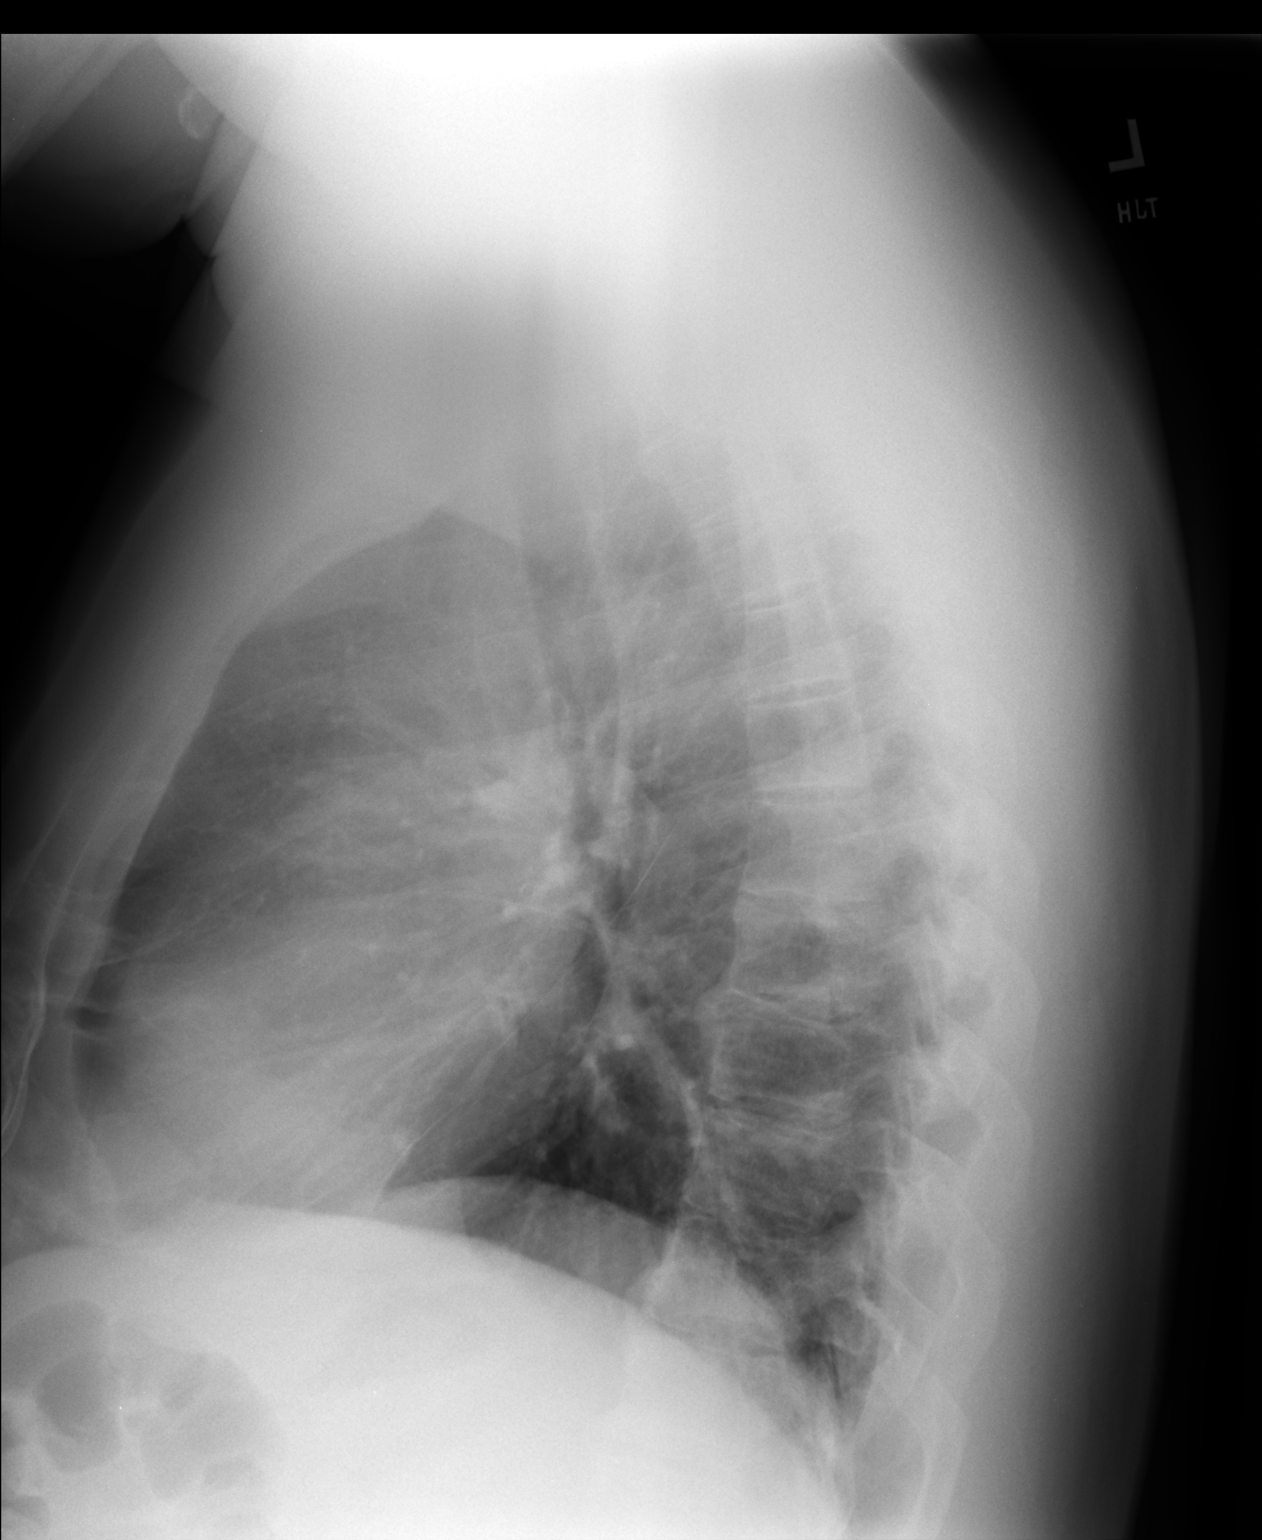

[2 of 2 positions shown; findings below may reference images not displayed]

FINDINGS: Lungs are clear.  No posterior infiltrate as questioned
on preliminary read.  Heart is normal size.  No effusions or acute
bony abnormality.
IMPRESSION: No active cardiopulmonary disease.

## 2012-01-20 MED ORDER — AZITHROMYCIN 250 MG PO TABS: ORAL_TABLET | ORAL | Status: AC

## 2012-01-20 MED ORDER — HYDROCODONE-HOMATROPINE 5-1.5 MG/5ML PO SYRP: ORAL_SOLUTION | ORAL | Status: DC

## 2012-01-20 NOTE — Patient Instructions (Signed)

## 2012-01-20 NOTE — Progress Notes (Signed)
  Subjective:    Patient ID: Harold Reading., male    DOB: 1951-08-13, 61 y.o.   MRN: [id number]  HPI enters with onset Thursday night of head congestion and cough. He has not had definite fever but he has had sweats and chills.He has  been having severe substernal chest pain but only when he coughs or sneezes. His cough is productive of yellowish phlegm. He denies discolored drainage from his nose. He has had no nausea vomiting or diarrhea.    Review of Systems are as relates to the present illness     Objective:   Physical Exam his  ENT exam reveals an alert cooperative male who is not in any distress. His TMs are clear his nose is congested throat is red neck is supple without adenopathy his chest exam reveals dry rhonchi sounds diffusely throughout both lung fields. His cardiac exam reveals a regular rate without murmurs or gallops. There is no significant chest wall tenderness.  [facility] reading (PRIMARY) by  Dr.Manika Hast chest x-ray reveals a possible infiltrate present posteriorly        Assessment & Plan:   Patient presents with a flulike illness. He has cough myalgias headache and chest discomfort we'll check a CBC chest x-ray flu swab. Patient tested positive for influenza B. His chest x-ray does have an infiltrate. We'll cover with an antibiotic and cough medication.

## 2013-10-20 ENCOUNTER — Encounter: Payer: Self-pay | Admitting: Gastroenterology

## 2014-04-28 ENCOUNTER — Encounter: Payer: Self-pay | Admitting: Gastroenterology

## 2014-07-13 ENCOUNTER — Encounter: Payer: Self-pay | Admitting: Gastroenterology

## 2014-08-30 ENCOUNTER — Ambulatory Visit (AMBULATORY_SURGERY_CENTER): Payer: Self-pay

## 2014-08-30 VITALS — Ht 69.5 in | Wt 254.8 lb

## 2014-08-30 DIAGNOSIS — Z86010 Personal history of colonic polyps: Secondary | ICD-10-CM

## 2014-08-30 MED ORDER — NA SULFATE-K SULFATE-MG SULF 17.5-3.13-1.6 GM/177ML PO SOLN: ORAL | Status: DC

## 2014-08-30 NOTE — Progress Notes (Signed)
Per pt, no allergies to soy or egg products.Pt not taking any weight loss meds or using  O2 at home. 

## 2014-09-13 ENCOUNTER — Ambulatory Visit (AMBULATORY_SURGERY_CENTER): Payer: Medicare Other | Admitting: Gastroenterology

## 2014-09-13 ENCOUNTER — Encounter: Payer: Self-pay | Admitting: Gastroenterology

## 2014-09-13 VITALS — BP 127/77 | HR 63 | Temp 98.1°F | Resp 27 | Ht 69.5 in | Wt 254.0 lb

## 2014-09-13 DIAGNOSIS — D123 Benign neoplasm of transverse colon: Secondary | ICD-10-CM

## 2014-09-13 DIAGNOSIS — Z86010 Personal history of colonic polyps: Secondary | ICD-10-CM

## 2014-09-13 HISTORY — PX: COLONOSCOPY: SHX174

## 2014-09-13 MED ORDER — SODIUM CHLORIDE 0.9 % IV SOLN: 500.0000 mL | INTRAVENOUS | Status: DC

## 2014-09-13 NOTE — Progress Notes (Signed)
Awake, spont resp vss, pleased with MAC, report to RN

## 2014-09-13 NOTE — Progress Notes (Signed)
Called to room to assist during endoscopic procedure.  Patient ID and intended procedure confirmed with present staff. Received instructions for my participation in the procedure from the performing physician.  

## 2014-09-13 NOTE — Patient Instructions (Signed)

## 2014-09-13 NOTE — Op Note (Signed)
[facility]  [address] [place], [id number]   COLONOSCOPY PROCEDURE REPORT  PATIENT: Harold [name], Harold [name]  MR#: [id number] BIRTHDATE: [birth year] , 1  yrs. old GENDER: male ENDOSCOPIST: Inda Castle, MD REFERRED WE:[id number] Blount, M.D. PROCEDURE DATE:  09/13/2014 PROCEDURE:   Colonoscopy with snare polypectomy and Colonoscopy with cold biopsy polypectomy First Screening Colonoscopy - Avg.  risk and is 50 yrs.  old or older - No.  Prior Negative Screening - Now for repeat screening. Above average risk  History of Adenoma - Now for follow-up colonoscopy & has been > or = to 3 yrs.  N/A  Polyps Removed Today? Yes. ASA CLASS:   Class II INDICATIONS:patient's family history of colon cancer, distant relatives. (grandparents) MEDICATIONS: Monitored anesthesia care and Propofol 200 mg IV  DESCRIPTION OF PROCEDURE:   After the risks benefits and alternatives of the procedure were thoroughly explained, informed consent was obtained.  The digital rectal exam revealed no abnormalities of the rectum.   The LB CF-H180AL Loaner [id number] endoscope was introduced through the anus and advanced to the cecum, which was identified by both the appendix and ileocecal valve. No adverse events experienced.   The quality of the prep was excellent using Suprep  The instrument was then slowly withdrawn as the colon was fully examined.      COLON FINDINGS: A sessile polyp measuring 2 mm in size was found in the proximal transverse colon.  A polypectomy was performed with cold forceps.   A flat polyp measuring 3 mm in size was found in the transverse colon.   A polypectomy was performed with a cold snare.  The resection was complete, the polyp tissue was completely retrieved and sent to histology.   The examination was otherwise normal.  Retroflexed views revealed no abnormalities. The time to cecum=3 minutes 07 seconds.  Withdrawal time=9 minutes 54 seconds. The scope was withdrawn  and the procedure completed. COMPLICATIONS: There were no immediate complications.  ENDOSCOPIC IMPRESSION: 1.   Sessile polyp was found in the proximal transverse colon; polypectomy was performed with cold forceps 2.   Flat polyp was found in the transverse colon and throughout the entire examined colon; polypectomy was performed with a cold snare 3.   The examination was otherwise normal  RECOMMENDATIONS: If the polyp(s) removed today are proven to be adenomatous (pre-cancerous) polyps, you will need a repeat colonoscopy in 5 years.  Otherwise you should continue to follow colorectal cancer screening guidelines for "routine risk" patients with colonoscopy in 10 years.  You will receive a letter within 1-2 weeks with the results of your biopsy as well as final recommendations.  Please call my office if you have not received a letter after 3 weeks.  eSigned:  Inda Castle, MD 09/13/2014 9:40 AM   cc:   PATIENT NAME:  Harold [name], Harold [name] MR#: [id number]

## 2014-09-14 ENCOUNTER — Telehealth: Payer: Self-pay | Admitting: *Deleted

## 2014-09-14 NOTE — Telephone Encounter (Signed)
  Follow up Call-  Call back number 09/13/2014  Post procedure Call Back phone  # [phone] or [phone]  Permission to leave phone message Yes     Patient questions:  Do you have a fever, pain , or abdominal swelling? No. Pain Score  0 *  Have you tolerated food without any problems? Yes.    Have you been able to return to your normal activities? Yes.    Do you have any questions about your discharge instructions: Diet   No. Medications  No. Follow up visit  No.  Do you have questions or concerns about your Care? No.  Actions: * If pain score is 4 or above: No action needed, pain <4.

## 2014-09-20 ENCOUNTER — Encounter: Payer: Self-pay | Admitting: Gastroenterology

## 2015-11-17 DIAGNOSIS — I10 Essential (primary) hypertension: Secondary | ICD-10-CM | POA: Diagnosis not present

## 2015-11-17 DIAGNOSIS — M549 Dorsalgia, unspecified: Secondary | ICD-10-CM | POA: Diagnosis not present

## 2015-12-01 DIAGNOSIS — M545 Low back pain: Secondary | ICD-10-CM | POA: Diagnosis not present

## 2015-12-01 DIAGNOSIS — I10 Essential (primary) hypertension: Secondary | ICD-10-CM | POA: Diagnosis not present

## 2016-01-01 ENCOUNTER — Encounter: Payer: Self-pay | Admitting: Gastroenterology

## 2016-01-05 DIAGNOSIS — M549 Dorsalgia, unspecified: Secondary | ICD-10-CM | POA: Diagnosis not present

## 2016-01-05 DIAGNOSIS — I10 Essential (primary) hypertension: Secondary | ICD-10-CM | POA: Diagnosis not present

## 2016-03-05 DIAGNOSIS — M545 Low back pain: Secondary | ICD-10-CM | POA: Diagnosis not present

## 2016-03-05 DIAGNOSIS — I10 Essential (primary) hypertension: Secondary | ICD-10-CM | POA: Diagnosis not present

## 2016-06-04 DIAGNOSIS — M545 Low back pain: Secondary | ICD-10-CM | POA: Diagnosis not present

## 2016-06-04 DIAGNOSIS — N5201 Erectile dysfunction due to arterial insufficiency: Secondary | ICD-10-CM | POA: Diagnosis not present

## 2016-06-04 DIAGNOSIS — I10 Essential (primary) hypertension: Secondary | ICD-10-CM | POA: Diagnosis not present

## 2016-06-04 DIAGNOSIS — E663 Overweight: Secondary | ICD-10-CM | POA: Diagnosis not present

## 2016-09-04 ENCOUNTER — Ambulatory Visit (INDEPENDENT_AMBULATORY_CARE_PROVIDER_SITE_OTHER): Payer: Medicare Other | Admitting: Physician Assistant

## 2016-09-04 VITALS — BP 122/84 | HR 78 | Temp 98.6°F | Resp 16 | Ht 69.5 in | Wt 263.0 lb

## 2016-09-04 DIAGNOSIS — L0291 Cutaneous abscess, unspecified: Secondary | ICD-10-CM | POA: Diagnosis not present

## 2016-09-04 MED ORDER — TRAMADOL HCL 50 MG PO TABS: 50.0000 mg | ORAL_TABLET | Freq: Three times a day (TID) | ORAL | 0 refills | Status: DC | PRN

## 2016-09-04 MED ORDER — DOXYCYCLINE HYCLATE 100 MG PO CAPS: 100.0000 mg | ORAL_CAPSULE | Freq: Two times a day (BID) | ORAL | 0 refills | Status: DC

## 2016-09-04 NOTE — Patient Instructions (Addendum)
Please start your antibiotic today and take as directed. Finish the entire course of it, even if you start to feel better.  Please apply warm compress to the area as much as possible over the next few days. Do not lay on your left side and do not lay on a heating pad.  Please return on Friday for drainage with Daphane Shepherd.   Thank you for coming in today. I hope you feel we met your needs.  Feel free to call [facility] if you have any questions or further requests.  Please consider signing up for MyChart if you do not already have it, as this is a great way to communicate with me.  Best,  Whitney McVey, PA-C    IF you received an x-ray today, you will receive an invoice from [facility]. Please contact [facility] at [phone] with questions or concerns regarding your invoice.   IF you received labwork today, you will receive an invoice from [employer]. Please contact [facility] at [phone] with questions or concerns regarding your invoice.   Our billing staff will not be able to assist you with questions regarding bills from these companies.  You will be contacted with the lab results as soon as they are available. The fastest way to get your results is to activate your My Chart account. Instructions are located on the last page of this paperwork. If you have not heard from [place] regarding the results in 2 weeks, please contact this office.

## 2016-09-04 NOTE — Progress Notes (Signed)
   Harold Corning Sr.  MRN: [id number] DOB: 17-Mar-1951  PCP: Kiondra Caicedo Palau, MD (Inactive)  Subjective:  Pt is a 65 year old man who presents to clinic for boil on left side x six days. States it started as a small bump, but has gotten bigger and worse recently. Noted yellowish drainage on his t-shirt the past few days. It is very painful. He can't sleep due to pain - he cannot get in a comfortable position. Motrin helped relieve pain some. Tried "boil-eaze" Has never had this before.  Denies fevers, chills, abdominal pain, chest pain, palpitations, headaches, dizziness.    Review of Systems  Constitutional: Negative for chills, diaphoresis and fever.  Respiratory: Negative for cough, chest tightness, shortness of breath and wheezing.   Cardiovascular: Negative for chest pain, palpitations and leg swelling.  Gastrointestinal: Negative for abdominal pain, constipation, diarrhea, nausea and vomiting.  Skin: Positive for color change and wound.  Neurological: Negative for dizziness, syncope, light-headedness and headaches.  Psychiatric/Behavioral: Positive for sleep disturbance.    There are no active problems to display for this patient.   Current Outpatient Prescriptions on File Prior to Visit  Medication Sig Dispense Refill  . amLODipine (NORVASC) 10 MG tablet Take 10 mg by mouth daily.    . Cholecalciferol (VITAMIN D3) 3000 UNITS TABS Take 5,000 Units by mouth daily.    . valsartan (DIOVAN) 40 MG tablet Take 160 mg by mouth 2 (two) times daily.     . furosemide (LASIX) 20 MG tablet Take 20 mg by mouth daily.     Marland Kitchen HYDROcodone-acetaminophen (NORCO) 5-325 MG per tablet Take 1 tablet by mouth every 6 (six) hours as needed.    Marland Kitchen HYDROcodone-homatropine (HYCODAN) 5-1.5 MG/5ML syrup Take 1 teaspoon every 4-6 hours as needed for cough (Patient not taking: Reported on 09/04/2016) 120 mL 0  . Multiple Vitamins-Minerals (MENS MULTIVITAMIN PLUS PO) Take by mouth daily.    . Omega-3  Fatty Acids (OMEGA 3 PO) Take by mouth daily.     No current facility-administered medications on file prior to visit.     No Known Allergies   Objective:  BP 122/84   Pulse 78   Temp 98.6 F (37 C) (Oral)   Resp 16   Ht 5' 9.5" (1.765 m)   Wt 263 lb (119.3 kg)   SpO2 96%   BMI 38.28 kg/m   Physical Exam  Constitutional: He is oriented to person, place, and time and well-developed, well-nourished, and in no distress. Vital signs are normal. No distress.  Cardiovascular: Normal rate, regular rhythm and normal heart sounds.   Pulmonary/Chest: Effort normal. No respiratory distress.  Neurological: He is alert and oriented to person, place, and time. GCS score is 15.  Skin: Skin is warm and dry.     Psychiatric: Mood, memory, affect and judgment normal.  Vitals reviewed.   Assessment and Plan :  1. Abscess - doxycycline (VIBRAMYCIN) 100 MG capsule; Take 1 capsule (100 mg total) by mouth 2 (two) times daily.  Dispense: 20 capsule; Refill: 0 - traMADol (ULTRAM) 50 MG tablet; Take 1 tablet (50 mg total) by mouth every 8 (eight) hours as needed.  Dispense: 30 tablet; Refill: 0 - Encouraged warm compress several times a day. RTC in two days for I&D.    Mercer Pod, PA-C  [facility] 09/04/2016 8:13 AM

## 2016-09-06 ENCOUNTER — Ambulatory Visit (INDEPENDENT_AMBULATORY_CARE_PROVIDER_SITE_OTHER): Payer: Medicare Other | Admitting: Physician Assistant

## 2016-09-06 VITALS — BP 124/72 | HR 88 | Temp 98.3°F | Resp 17 | Ht 69.5 in

## 2016-09-06 DIAGNOSIS — L02211 Cutaneous abscess of abdominal wall: Secondary | ICD-10-CM | POA: Diagnosis not present

## 2016-09-06 NOTE — Progress Notes (Signed)
Chief Complaint  Patient presents with  . Cyst    LEFT SIDE OF STOMACH    History of Present Illness: Patient presents for evaluation of cellulitis of the LEFT lower abdominal wall, in hopes of I&D.  He presented here two days ago for same, but the area was not fluctuant. He was started on doxycycline and hydrocodone for pain, advised to apply warm compresses and return here for anticipated I&D today.  The area is still exquisitely tender and he notes that there is some yellowing oozing on his tshirt today.  No fever, chills, nausea, vomiting. Tolerating the medications without difficulty.   No Known Allergies  Prior to Admission medications   Medication Sig Start Date End Date Taking? Authorizing Provider  amLODipine (NORVASC) 10 MG tablet Take 10 mg by mouth daily.   Yes Historical Provider, MD  Cholecalciferol (VITAMIN D3) 3000 UNITS TABS Take 5,000 Units by mouth daily.   Yes Historical Provider, MD  doxycycline (VIBRAMYCIN) 100 MG capsule Take 1 capsule (100 mg total) by mouth 2 (two) times daily. 09/04/16  Yes Elizabeth Whitney McVey, PA-C  furosemide (LASIX) 20 MG tablet Take 20 mg by mouth daily.    Yes Historical Provider, MD  HYDROcodone-acetaminophen (NORCO) 5-325 MG per tablet Take 1 tablet by mouth every 6 (six) hours as needed.   Yes Historical Provider, MD  Multiple Vitamins-Minerals (MENS MULTIVITAMIN PLUS PO) Take by mouth daily.   Yes Historical Provider, MD  Omega-3 Fatty Acids (OMEGA 3 PO) Take by mouth daily.   Yes Historical Provider, MD  traMADol (ULTRAM) 50 MG tablet Take 1 tablet (50 mg total) by mouth every 8 (eight) hours as needed. 09/04/16  Yes Elizabeth Whitney McVey, PA-C  valsartan (DIOVAN) 40 MG tablet Take 160 mg by mouth 2 (two) times daily.    Yes Historical Provider, MD    There are no active problems to display for this patient.    Physical Exam  Constitutional: He is oriented to person, place, and time. He appears well-developed and  well-nourished. He is active and cooperative. No distress.  BP 124/72 (BP Location: Right Arm, Patient Position: Sitting, Cuff Size: Large)   Pulse 88   Temp 98.3 F (36.8 C) (Oral)   Resp 17   Ht 5' 9.5" (1.765 m)   SpO2 97%    Eyes: Conjunctivae are normal.  Pulmonary/Chest: Effort normal.  Neurological: He is alert and oriented to person, place, and time.  Skin: Skin is warm and dry.     Psychiatric: He has a normal mood and affect. His speech is normal and behavior is normal.    PROCEDURE: Verbal Consent Obtained. Local anesthesia with 2 cc of 2% lidocaine plain.  11 blade used to incise the lesion centrally.  Purulence expressed, though not as much as anticipated. Wound probed to a depth of 2.5-3 cm. Irrigated wound with 3 cc of 2% lidocaine and packed with 1/4 inch plain packing.  Cleansed and dressed. Anticipate the wound cavity will expand further over the next 24-48 hours.   ASSESSMENT & PLAN:  1. Abscess of abdominal wall Continue antibiotic, PRN hydrocodone, warm compresses and rest. Dressing changes as needed. RTC 24 hours for follow-up with Philis Fendt, PA-C. - WOUND CULTURE   Fara Chute, PA-C Physician Assistant-Certified [facility]

## 2016-09-06 NOTE — Patient Instructions (Addendum)
Continue the antibiotic as prescribed. Apply a warm compress to the area for 15-20 minutes 2-4 times each day. Change the dressing if it becomes saturated, leaks or falls off.     IF you received an x-ray today, you will receive an invoice from [facility]. Please contact [facility] at [phone] with questions or concerns regarding your invoice.   IF you received labwork today, you will receive an invoice from [employer]. Please contact [facility] at [phone] with questions or concerns regarding your invoice.   Our billing staff will not be able to assist you with questions regarding bills from these companies.  You will be contacted with the lab results as soon as they are available. The fastest way to get your results is to activate your My Chart account. Instructions are located on the last page of this paperwork. If you have not heard from [place] regarding the results in 2 weeks, please contact this office.

## 2016-09-07 ENCOUNTER — Ambulatory Visit (INDEPENDENT_AMBULATORY_CARE_PROVIDER_SITE_OTHER): Payer: Medicare Other | Admitting: Physician Assistant

## 2016-09-07 VITALS — BP 122/80 | HR 81 | Temp 98.5°F | Resp 18 | Ht 69.5 in

## 2016-09-07 DIAGNOSIS — Z4889 Encounter for other specified surgical aftercare: Secondary | ICD-10-CM

## 2016-09-07 NOTE — Progress Notes (Signed)
   09/07/2016 10:49 AM   DOB: [birth year] / MRN: [id number]  SUBJECTIVE:  Harold [name] is a well appearing 65 y.o. here today for wound care. He denies exquisite tenderness at the site of the wound, nausea, emesis, fever and chills.  He has been compliant with medical therapy and recommendations thus far.   Per my colleague, the wound was drained two days ago but likely contained loculations it is was felt, at the time of the initial drainage, that further I&D would be required.   He has No Known Allergies.   He  has a past medical history of Back pain and Hypertension.    He  reports that he quit smoking about 33 years ago. He has never used smokeless tobacco. He reports that he does not drink alcohol or use drugs. He  has no sexual activity history on file. The patient  has a past surgical history that includes Knee surgery and Tonsillectomy.  His family history includes Dementia in his mother.  ROS  Per HPI  Problem list and medications reviewed and updated by myself where necessary, and exist elsewhere in the encounter.   OBJECTIVE:  BP 122/80 (BP Location: Right Arm, Patient Position: Sitting, Cuff Size: Large)   Pulse 81   Temp 98.5 F (36.9 C) (Oral)   Resp 18   Ht 5' 9.5" (1.765 m)   SpO2 94%  CrCl cannot be calculated (Patient's most recent lab result is older than the maximum 21 days allowed.).  Physical Exam  Constitutional: He is oriented to person, place, and time. He appears well-developed and well-nourished.  Cardiovascular: Normal rate.   Pulmonary/Chest: Effort normal and breath sounds normal.  Abdominal: Soft. Bowel sounds are normal.  Musculoskeletal: Normal range of motion.  Neurological: He is alert and oriented to person, place, and time.  Skin: He is not diaphoretic.       Results for orders placed or performed in visit on 09/06/16 (from the past 48 hour(s))  WOUND CULTURE     Status: None (Preliminary result)   Collection Time: 09/06/16  8:41 AM    Result Value Ref Range   Gram Stain Moderate    Gram Stain WBC present-both PMN and Mononuclear    Gram Stain Rare Squamous Epithelial Cells Present    Gram Stain Few Gram Positive Cocci In Clusters    Preliminary Report Moderate STAPHYLOCOCCUS AUREUS     Comment: Rifampin and Gentamicin should not be used as single drugs for treatment of Staph infections.     ASSESSMENT AND PLAN  Harold [name] was seen today for suture / staple removal.  Diagnoses and all orders for this visit:  Encounter for postoperative wound care Comments: Wound doing well. See exam for details.  Culture showig Staph species.  Advised pt continue doxy and pain med as needed.  Appointment in place in 48 hours.     The patient was advised to call or return to clinic if he does not see an improvement in symptoms or to seek the care of the closest emergency department if he worsens with the above plan.   Philis Fendt, MHS, PA-C [facility] 09/07/2016 10:49 AM

## 2016-09-07 NOTE — Patient Instructions (Signed)
     IF you received an x-ray today, you will receive an invoice from [facility]. Please contact [facility] at [phone] with questions or concerns regarding your invoice.   IF you received labwork today, you will receive an invoice from [facility]. Please contact [facility] at [phone] with questions or concerns regarding your invoice.   Our billing staff will not be able to assist you with questions regarding bills from these companies.  You will be contacted with the lab results as soon as they are available. The fastest way to get your results is to activate your My Chart account. Instructions are located on the last page of this paperwork. If you have not heard from us regarding the results in 2 weeks, please contact this office.      

## 2016-09-08 LAB — WOUND CULTURE

## 2016-09-09 ENCOUNTER — Ambulatory Visit (INDEPENDENT_AMBULATORY_CARE_PROVIDER_SITE_OTHER): Payer: Medicare Other | Admitting: Physician Assistant

## 2016-09-09 VITALS — BP 114/58 | HR 74 | Temp 97.8°F | Resp 16 | Ht 69.5 in | Wt 263.6 lb

## 2016-09-09 DIAGNOSIS — L02211 Cutaneous abscess of abdominal wall: Secondary | ICD-10-CM

## 2016-09-09 DIAGNOSIS — Z4889 Encounter for other specified surgical aftercare: Secondary | ICD-10-CM

## 2016-09-09 NOTE — Progress Notes (Signed)
Patient ID: Harold Kinsman., male    DOB: [birth year]  Age: 65 y.o. MRN: [id number]  Chief Complaint  Patient presents with  . Follow-up    wound care recheck     Subjective:   Presents for wound care following I&D if abscess on the LEFT lower abdominal wall on 11/24.   As I suspected there was perhaps deeper abscess, he was advised to return on 11/25 for re-evaluation. He was kindly seen by a colleague, and reported reduced tenderness. Additional abscess pockets/loculations were opened with hemostats and the wound was repacked.  Today he reports essentially no pain. Continues to tolerate the doxycycline without difficulty.  Current allergies, medications, problem list, past/family and social histories reviewed.  Objective:  BP (!) 114/58 (BP Location: Right Arm, Patient Position: Sitting, Cuff Size: Large)   Pulse 74   Temp 97.8 F (36.6 C) (Oral)   Resp 16   Ht 5' 9.5" (1.765 m)   Wt 263 lb 9.6 oz (119.6 kg)   SpO2 96%   BMI 38.37 kg/m   WDWNBM, A&O x 3. Dressing and packing removed. No erythema. Some hyperpigmentation consistent with previous edema. Induration persists linearly, both medially and laterally, but is no longer tender. Small amount of yellow eschar noted in the wound bed that is otherwise beefy red.  No purulence expressed. Gently repacked. Dressed.  Assessment & Plan:   Assessment: 1. Abscess of abdominal wall   2. Encounter for postoperative wound care       Plan: Continue doxycycline, warm compresses and PRN dressing changes. RTC Thursday 11/30 for wound care.  No orders of the defined types were placed in this encounter.   No orders of the defined types were placed in this encounter.        Patient Instructions       IF you received an x-ray today, you will receive an invoice from [facility]. Please contact [facility] at [phone] with questions or concerns regarding your invoice.   IF you received  labwork today, you will receive an invoice from [employer]. Please contact [facility] at [phone] with questions or concerns regarding your invoice.   Our billing staff will not be able to assist you with questions regarding bills from these companies.  You will be contacted with the lab results as soon as they are available. The fastest way to get your results is to activate your My Chart account. Instructions are located on the last page of this paperwork. If you have not heard from [place] regarding the results in 2 weeks, please contact this office.         No Follow-up on file.   Harrison Mons, PA-C 09/09/2016

## 2016-09-09 NOTE — Patient Instructions (Signed)
     IF you received an x-ray today, you will receive an invoice from [facility]. Please contact [facility] at [phone] with questions or concerns regarding your invoice.   IF you received labwork today, you will receive an invoice from [facility]. Please contact [facility] at [phone] with questions or concerns regarding your invoice.   Our billing staff will not be able to assist you with questions regarding bills from these companies.  You will be contacted with the lab results as soon as they are available. The fastest way to get your results is to activate your My Chart account. Instructions are located on the last page of this paperwork. If you have not heard from us regarding the results in 2 weeks, please contact this office.      

## 2016-09-12 ENCOUNTER — Ambulatory Visit (INDEPENDENT_AMBULATORY_CARE_PROVIDER_SITE_OTHER): Payer: Medicare Other | Admitting: Physician Assistant

## 2016-09-12 VITALS — BP 110/72 | HR 78 | Temp 98.6°F | Resp 18 | Ht 69.5 in

## 2016-09-12 DIAGNOSIS — L02211 Cutaneous abscess of abdominal wall: Secondary | ICD-10-CM

## 2016-09-12 DIAGNOSIS — Z4889 Encounter for other specified surgical aftercare: Secondary | ICD-10-CM

## 2016-09-12 NOTE — Patient Instructions (Signed)
     IF you received an x-ray today, you will receive an invoice from [facility]. Please contact [facility] at [phone] with questions or concerns regarding your invoice.   IF you received labwork today, you will receive an invoice from [facility]. Please contact [facility] at [phone] with questions or concerns regarding your invoice.   Our billing staff will not be able to assist you with questions regarding bills from these companies.  You will be contacted with the lab results as soon as they are available. The fastest way to get your results is to activate your My Chart account. Instructions are located on the last page of this paperwork. If you have not heard from us regarding the results in 2 weeks, please contact this office.      

## 2016-09-12 NOTE — Progress Notes (Signed)
Chief Complaint  Patient presents with  . Wound Check    History of Present Illness: Patient presents for wound care.  S/P I&D of an abscess of the LEFT lower abdomen on 09/06/2016. He continues to do well, tolerate the doxycycline.  No pain. Minimal drainage.   No Known Allergies  Prior to Admission medications   Medication Sig Start Date End Date Taking? Authorizing Provider  amLODipine (NORVASC) 10 MG tablet Take 10 mg by mouth daily.   Yes Historical Provider, MD  Cholecalciferol (VITAMIN D3) 3000 UNITS TABS Take 5,000 Units by mouth daily.   Yes Historical Provider, MD  doxycycline (VIBRAMYCIN) 100 MG capsule Take 1 capsule (100 mg total) by mouth 2 (two) times daily. 09/04/16  Yes Elizabeth Whitney McVey, PA-C  furosemide (LASIX) 20 MG tablet Take 20 mg by mouth daily.    Yes Historical Provider, MD  HYDROcodone-acetaminophen (NORCO) 5-325 MG per tablet Take 1 tablet by mouth every 6 (six) hours as needed.   Yes Historical Provider, MD  Multiple Vitamins-Minerals (MENS MULTIVITAMIN PLUS PO) Take by mouth daily.   Yes Historical Provider, MD  Omega-3 Fatty Acids (OMEGA 3 PO) Take by mouth daily.   Yes Historical Provider, MD  traMADol (ULTRAM) 50 MG tablet Take 1 tablet (50 mg total) by mouth every 8 (eight) hours as needed. 09/04/16  Yes Elizabeth Whitney McVey, PA-C  valsartan (DIOVAN) 40 MG tablet Take 160 mg by mouth 2 (two) times daily.    Yes Historical Provider, MD    There are no active problems to display for this patient.    Physical Exam  Constitutional: He is oriented to person, place, and time. He appears well-developed and well-nourished. He is active and cooperative. No distress.  BP 110/72 (BP Location: Right Arm, Patient Position: Sitting, Cuff Size: Large)   Pulse 78   Temp 98.6 F (37 C) (Oral)   Resp 18   Ht 5' 9.5" (1.765 m)   SpO2 97%    Eyes: Conjunctivae are normal.  Pulmonary/Chest: Effort normal.  Neurological: He is alert and oriented to  person, place, and time.  Skin:     Psychiatric: He has a normal mood and affect. His speech is normal and behavior is normal.      ASSESSMENT & PLAN:  1. Abscess of abdominal wall 2. Encounter for postoperative wound care No additional packing placed. Local wound care. RTC if swelling, pain drainage increases.   Fara Chute, PA-C Physician Assistant-Certified [facility]

## 2016-09-12 NOTE — Progress Notes (Signed)
   Subjective:    Patient ID: Harold [name]., male    DOB: 11-06-1950, 65 y.o.   MRN: [id number]  Chief Complaint  Patient presents with  . Suture / Staple Removal   HPI: Presents for wound care follow-up. Patient was originally seen on 09/04/2016 and prescribed Doxycycline and then returned to clinic on 09/06/2016 for I&D. Patient was seen every 48 hours following that visit for follow-up wound care, abscess was re-explored with more purulent drainage on 09/07/2016 and then repacked and repacked again on 09/09/2016.  Patient states the pain, erythema, swelling, and induration have decreased significantly over the past 8 days. Tolerating Doxycycline without problems. Denies fevers, chills, nausea or vomiting at home. States he has noticed some itching surrounding the site. Denies saturation of drainage in the dressing. States he does not change the dressing at home, waits to have it done here because he has been here every 48 hours.  Review of Systems Pertinent ROS mentioned above in HPI.    Objective: Blood pressure 110/72, pulse 78, temperature 98.6 F (37 C), temperature source Oral, resp. rate 18, height 5' 9.5" (1.765 m), SpO2 97 %.   Physical Exam  Constitutional: He is oriented to person, place, and time. He appears well-developed and well-nourished. No distress.  HENT:  Head: Normocephalic and atraumatic.  Mouth/Throat: No oropharyngeal exudate.  Eyes: Right eye exhibits no discharge. Left eye exhibits no discharge. No scleral icterus.  Abdominal: There is no tenderness.  Non-tender to palpation in area surrounding wound on left lower abdomen.   Neurological: He is alert and oriented to person, place, and time.  Skin: Skin is warm and dry. No rash noted. He is not diaphoretic.     Psychiatric: He has a normal mood and affect. His speech is normal and behavior is normal.   Results for orders placed or performed in visit on 09/06/16  WOUND CULTURE  Result Value Ref  Range   Culture      Moderate METHICILLIN RESISTANT STAPHYLOCOCCUS AUREUS   Gram Stain Moderate    Gram Stain WBC present-both PMN and Mononuclear    Gram Stain Rare Squamous Epithelial Cells Present    Gram Stain Few Gram Positive Cocci In Clusters    Organism ID, Bacteria METHICILLIN RESISTANT STAPHYLOCOCCUS AUREUS       Susceptibility   Methicillin resistant staphylococcus aureus -  (no method available)    OXACILLIN >=4 Resistant     CEFAZOLIN  Resistant     GENTAMICIN <=0.5 Sensitive     CIPROFLOXACIN 4 Resistant     LEVOFLOXACIN 4 Intermediate     TRIMETH/SULFA <=10 Sensitive     VANCOMYCIN 1 Sensitive     CLINDAMYCIN <=0.25 Sensitive     ERYTHROMYCIN >=8 Resistant     LINEZOLID 2 Sensitive     RIFAMPIN <=0.5 Sensitive     TETRACYCLINE <=1 Sensitive        Assessment & Plan:  1. Abscess of abdominal wall 2. Encounter for postoperative wound care Abscess appears to be healing appropriately. Wound with beefy red center and no spontaneous drainage upon palpation. Erythema and induration has greatly decreased according to comparison in review of previous visits. Bandaid placed in clinic and advised patient to change daily and lightly cleansed site with soap and water. Instructed to continue course of Doxycycline as prescribed. RTC if site is worsening or new symptoms such as fevers, chills, nausea, or vomiting arise.

## 2016-10-02 DIAGNOSIS — Z0000 Encounter for general adult medical examination without abnormal findings: Secondary | ICD-10-CM | POA: Diagnosis not present

## 2016-10-02 DIAGNOSIS — M549 Dorsalgia, unspecified: Secondary | ICD-10-CM | POA: Diagnosis not present

## 2016-10-02 DIAGNOSIS — I10 Essential (primary) hypertension: Secondary | ICD-10-CM | POA: Diagnosis not present

## 2016-10-02 DIAGNOSIS — H9313 Tinnitus, bilateral: Secondary | ICD-10-CM | POA: Diagnosis not present

## 2016-11-05 DIAGNOSIS — Z125 Encounter for screening for malignant neoplasm of prostate: Secondary | ICD-10-CM | POA: Diagnosis not present

## 2016-11-07 DIAGNOSIS — H903 Sensorineural hearing loss, bilateral: Secondary | ICD-10-CM | POA: Diagnosis not present

## 2016-11-07 DIAGNOSIS — H9313 Tinnitus, bilateral: Secondary | ICD-10-CM | POA: Insufficient documentation

## 2017-01-01 DIAGNOSIS — E0800 Diabetes mellitus due to underlying condition with hyperosmolarity without nonketotic hyperglycemic-hyperosmolar coma (NKHHC): Secondary | ICD-10-CM | POA: Diagnosis not present

## 2017-01-01 DIAGNOSIS — M549 Dorsalgia, unspecified: Secondary | ICD-10-CM | POA: Diagnosis not present

## 2017-01-01 DIAGNOSIS — H9313 Tinnitus, bilateral: Secondary | ICD-10-CM | POA: Diagnosis not present

## 2017-01-01 DIAGNOSIS — I10 Essential (primary) hypertension: Secondary | ICD-10-CM | POA: Diagnosis not present

## 2017-02-05 DIAGNOSIS — R972 Elevated prostate specific antigen [PSA]: Secondary | ICD-10-CM | POA: Diagnosis not present

## 2017-04-24 DIAGNOSIS — R972 Elevated prostate specific antigen [PSA]: Secondary | ICD-10-CM | POA: Diagnosis not present

## 2017-04-29 ENCOUNTER — Other Ambulatory Visit: Payer: Self-pay | Admitting: Urology

## 2017-04-29 DIAGNOSIS — R972 Elevated prostate specific antigen [PSA]: Secondary | ICD-10-CM

## 2017-05-06 DIAGNOSIS — R7309 Other abnormal glucose: Secondary | ICD-10-CM | POA: Diagnosis not present

## 2017-05-06 DIAGNOSIS — M130 Polyarthritis, unspecified: Secondary | ICD-10-CM | POA: Diagnosis not present

## 2017-05-06 DIAGNOSIS — M545 Low back pain: Secondary | ICD-10-CM | POA: Diagnosis not present

## 2017-05-06 DIAGNOSIS — I10 Essential (primary) hypertension: Secondary | ICD-10-CM | POA: Diagnosis not present

## 2017-05-14 ENCOUNTER — Ambulatory Visit
Admission: RE | Admit: 2017-05-14 | Discharge: 2017-05-14 | Disposition: A | Discharge status: Home / Self Care | Payer: Medicare Other | Source: Ambulatory Visit | Attending: Urology | Admitting: Urology

## 2017-05-14 DIAGNOSIS — C61 Malignant neoplasm of prostate: Secondary | ICD-10-CM | POA: Diagnosis not present

## 2017-05-14 DIAGNOSIS — R972 Elevated prostate specific antigen [PSA]: Secondary | ICD-10-CM

## 2017-05-14 IMAGING — MR MR PROSTATE WO/W CM
56 series · 56 of 56 positions shown · IV contrast (Multihance 20 ML)
Comparison: None.

CLINICAL DATA: 65-year-old male with elevated PSA (6.6 on
[DATE]).

EXAM:
MR PROSTATE WITHOUT AND WITH CONTRAST
TECHNIQUE: Multiplanar multisequence MRI images were obtained of the pelvis
centered about the prostate. Pre and post contrast images were
obtained.
CONTRAST:  20 mL MultiHance
Creatinine was obtained on site at [HOSPITAL] at [HOSPITAL].
Results: Creatinine 1.0 mg/dL.

[Series 3: T1 · axial · 8.0mm · 1.06mm/px · 1 of 28 slices shown (1 of 2)]
[im 1/28]
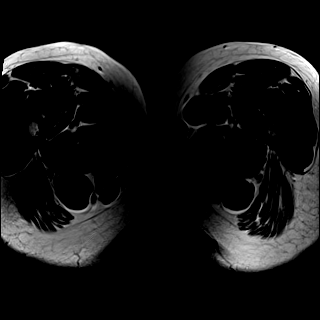

[Series 4: bSSFP fat-sat · axial · 8.0mm · 0.74mm/px · 1 of 28 slices shown]
[im 1/28]
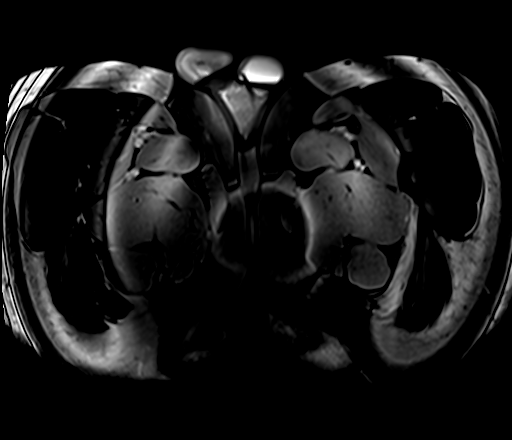

[Series 5: T2 · sagittal · 3.5mm · 0.56mm/px · 1 of 44 slices shown (1 of 4)]
[im 1/44]
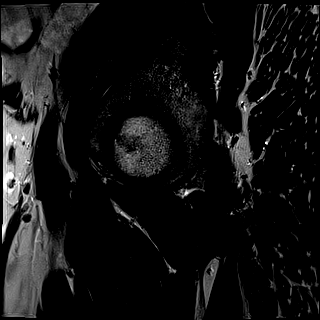

[Series 6: T1 · axial · 3.0mm · 0.31mm/px · 1 of 24 slices shown (2 of 2)]
[im 1/24]
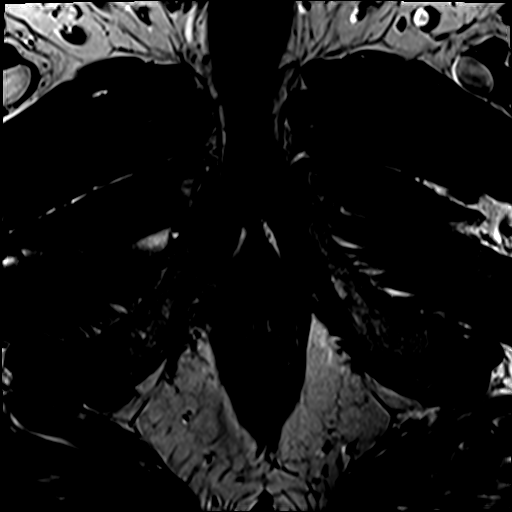

[Series 7: T2 · axial · 3.5mm · 0.56mm/px · 1 of 23 slices shown (2 of 4)]
[im 1/23]
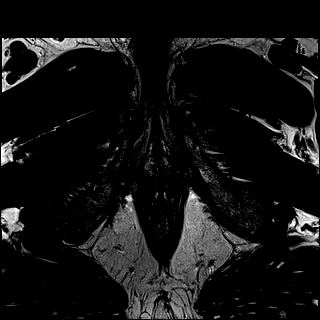

[Series 8: T2 · axial · 1.0mm · 1.04mm/px · 1 of 80 slices shown (3 of 4)]
[im 1/80]
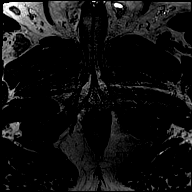

[Series 9: T2 · coronal · 3.5mm · 0.56mm/px · 1 of 23 slices shown (4 of 4)]
[im 1/23]
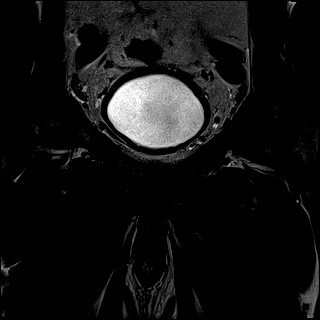

[Series 10: DWI · axial · 3.5mm · 1.56mm/px · 1 of 57 slices shown (1 of 2)]
[im 1/57]
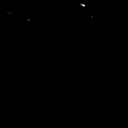

[Series 11: DWI · axial · 3.5mm · 1.56mm/px · 1 of 20 slices shown (2 of 2)]
[im 1/20]
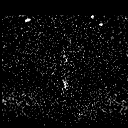

[Series 12: pre t1_twist_tra_dyn_ttc=6.1s · axial · non-contrast · 3.5mm · 0.83mm/px · 1 of 20 slices shown]
[im 1/20]
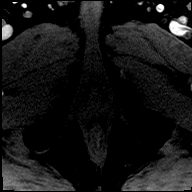

[Series 13: post t1_twist_tra_dyn-copy center · axial · 3.5mm · 0.83mm/px · 1 of 20 slices shown (1 of 24)]
[im 1/20]
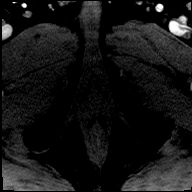

[Series 14: post t1_twist_tra_dyn-copy center · axial · 3.5mm · 0.83mm/px · 1 of 20 slices shown (2 of 24)]
[im 1/20]
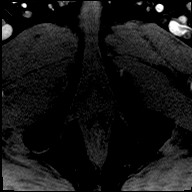

[Series 15: post t1_twist_tra_dyn-copy cent_sub_ttc=26.7s · axial · 3.5mm · 0.83mm/px · 1 of 19 slices shown]
[im 1/19]
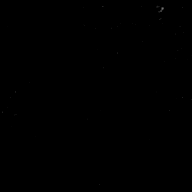

[Series 16: post t1_twist_tra_dyn-copy center · axial · 3.5mm · 0.83mm/px · 1 of 20 slices shown (3 of 24)]
[im 1/20]
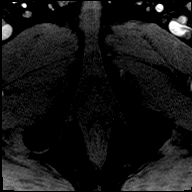

[Series 17: post t1_twist_tra_dyn-copy cent_sub_ttc=38.6s · axial · 3.5mm · 0.83mm/px · 1 of 20 slices shown]
[im 1/20]
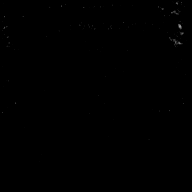

[Series 18: post t1_twist_tra_dyn-copy center · axial · 3.5mm · 0.83mm/px · 1 of 20 slices shown (4 of 24)]
[im 1/20]
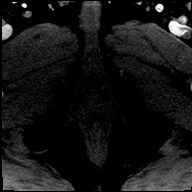

[Series 19: post t1_twist_tra_dyn-copy cent_sub_ttc=50.6s · axial · 3.5mm · 0.83mm/px · 1 of 20 slices shown]
[im 1/20]
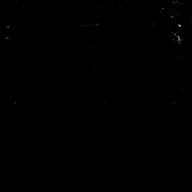

[Series 20: post t1_twist_tra_dyn-copy center · axial · 3.5mm · 0.83mm/px · 1 of 20 slices shown (5 of 24)]
[im 1/20]
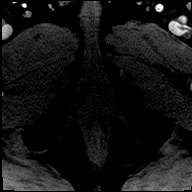

[Series 21: post t1_twist_tra_dyn-copy cent_sub_ttc=62.6s · axial · 3.5mm · 0.83mm/px · 1 of 20 slices shown]
[im 1/20]
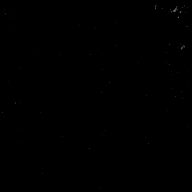

[Series 22: post t1_twist_tra_dyn-copy center · axial · 3.5mm · 0.83mm/px · 1 of 20 slices shown (6 of 24)]
[im 1/20]
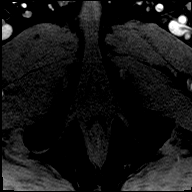

[Series 23: post t1_twist_tra_dyn-copy cent_sub_ttc=74.5s · axial · 3.5mm · 0.83mm/px · 1 of 20 slices shown]
[im 1/20]
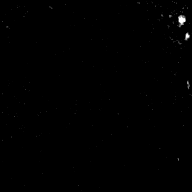

[Series 24: post t1_twist_tra_dyn-copy center · axial · 3.5mm · 0.83mm/px · 1 of 20 slices shown (7 of 24)]
[im 1/20]
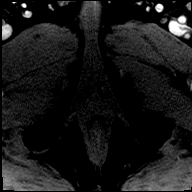

[Series 25: post t1_twist_tra_dyn-copy cent_sub_ttc=86.5s · axial · 3.5mm · 0.83mm/px · 1 of 20 slices shown]
[im 1/20]
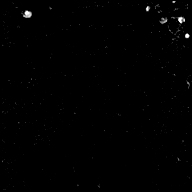

[Series 26: post t1_twist_tra_dyn-copy center · axial · 3.5mm · 0.83mm/px · 1 of 20 slices shown (8 of 24)]
[im 1/20]
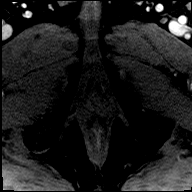

[Series 27: post t1_twist_tra_dyn-copy cent_sub_ttc=98.5s · axial · 3.5mm · 0.83mm/px · 1 of 20 slices shown]
[im 1/20]
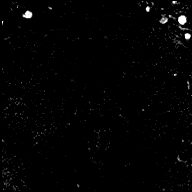

[Series 28: post t1_twist_tra_dyn-copy center · axial · 3.5mm · 0.83mm/px · 1 of 20 slices shown (9 of 24)]
[im 1/20]
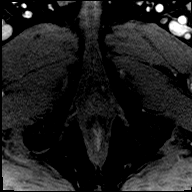

[Series 29: post t1_twist_tra_dyn-copy cent_sub_ttc=110.4s · axial · 3.5mm · 0.83mm/px · 1 of 20 slices shown]
[im 1/20]
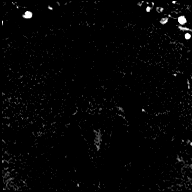

[Series 30: post t1_twist_tra_dyn-copy center · axial · 3.5mm · 0.83mm/px · 1 of 20 slices shown (10 of 24)]
[im 1/20]
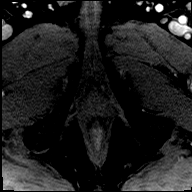

[Series 31: post t1_twist_tra_dyn-copy cent_sub_ttc=122.4s · axial · 3.5mm · 0.83mm/px · 1 of 20 slices shown]
[im 1/20]
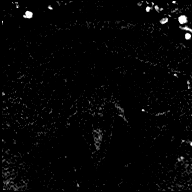

[Series 32: post t1_twist_tra_dyn-copy center · axial · 3.5mm · 0.83mm/px · 1 of 20 slices shown (11 of 24)]
[im 1/20]
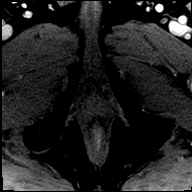

[Series 33: post t1_twist_tra_dyn-copy cent_sub_ttc=134.4s · axial · 3.5mm · 0.83mm/px · 1 of 20 slices shown]
[im 1/20]
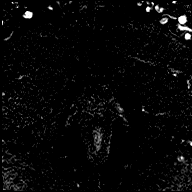

[Series 34: post t1_twist_tra_dyn-copy center · axial · 3.5mm · 0.83mm/px · 1 of 20 slices shown (12 of 24)]
[im 1/20]
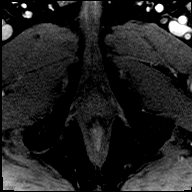

[Series 35: post t1_twist_tra_dyn-copy cent_sub_ttc=146.3s · axial · 3.5mm · 0.83mm/px · 1 of 20 slices shown]
[im 1/20]
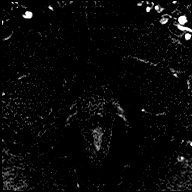

[Series 36: post t1_twist_tra_dyn-copy center · axial · 3.5mm · 0.83mm/px · 1 of 20 slices shown (13 of 24)]
[im 1/20]
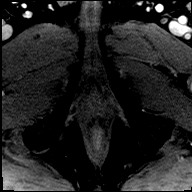

[Series 37: post t1_twist_tra_dyn-copy cent_sub_ttc=158.3s · axial · 3.5mm · 0.83mm/px · 1 of 20 slices shown]
[im 1/20]
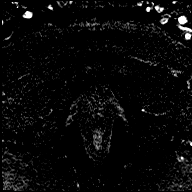

[Series 38: post t1_twist_tra_dyn-copy center · axial · 3.5mm · 0.83mm/px · 1 of 20 slices shown (14 of 24)]
[im 1/20]
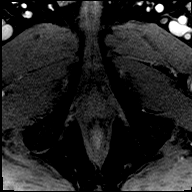

[Series 39: post t1_twist_tra_dyn-copy cent_sub_ttc=170.3s · axial · 3.5mm · 0.83mm/px · 1 of 20 slices shown]
[im 1/20]
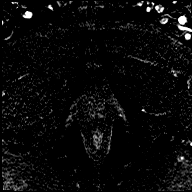

[Series 40: post t1_twist_tra_dyn-copy center · axial · 3.5mm · 0.83mm/px · 1 of 20 slices shown (15 of 24)]
[im 1/20]
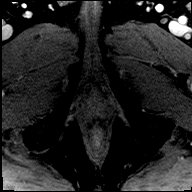

[Series 41: post t1_twist_tra_dyn-copy cent_sub_ttc=182.2s · axial · 3.5mm · 0.83mm/px · 1 of 20 slices shown]
[im 1/20]
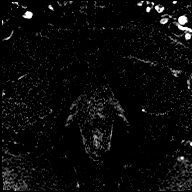

[Series 42: post t1_twist_tra_dyn-copy center · axial · 3.5mm · 0.83mm/px · 1 of 20 slices shown (16 of 24)]
[im 1/20]
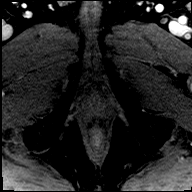

[Series 43: post t1_twist_tra_dyn-copy cent_sub_ttc=194.2s · axial · 3.5mm · 0.83mm/px · 1 of 20 slices shown]
[im 1/20]
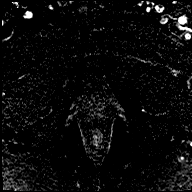

[Series 44: post t1_twist_tra_dyn-copy center · axial · 3.5mm · 0.83mm/px · 1 of 20 slices shown (17 of 24)]
[im 1/20]
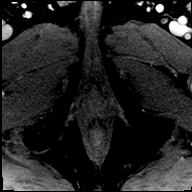

[Series 45: post t1_twist_tra_dyn-copy cent_sub_ttc=206.2s · axial · 3.5mm · 0.83mm/px · 1 of 20 slices shown]
[im 1/20]
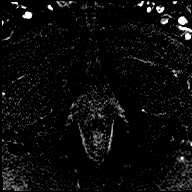

[Series 46: post t1_twist_tra_dyn-copy center · axial · 3.5mm · 0.83mm/px · 1 of 20 slices shown (18 of 24)]
[im 1/20]
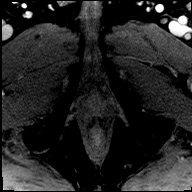

[Series 47: post t1_twist_tra_dyn-copy cent_sub_ttc=218.1s · axial · 3.5mm · 0.83mm/px · 1 of 20 slices shown]
[im 1/20]
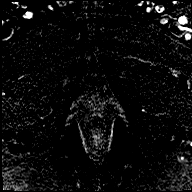

[Series 48: post t1_twist_tra_dyn-copy center · axial · 3.5mm · 0.83mm/px · 1 of 20 slices shown (19 of 24)]
[im 1/20]
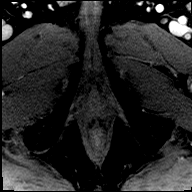

[Series 49: post t1_twist_tra_dyn-copy cent_sub_ttc=230.1s · axial · 3.5mm · 0.83mm/px · 1 of 20 slices shown]
[im 1/20]
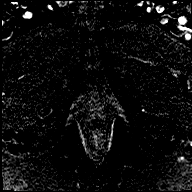

[Series 50: post t1_twist_tra_dyn-copy center · axial · 3.5mm · 0.83mm/px · 1 of 20 slices shown (20 of 24)]
[im 1/20]
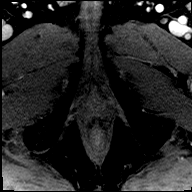

[Series 51: post t1_twist_tra_dyn-copy cent_sub_ttc=242.1s · axial · 3.5mm · 0.83mm/px · 1 of 20 slices shown]
[im 1/20]
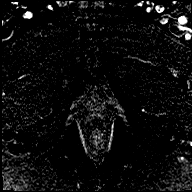

[Series 52: post t1_twist_tra_dyn-copy center · axial · 3.5mm · 0.83mm/px · 1 of 20 slices shown (21 of 24)]
[im 1/20]
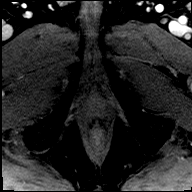

[Series 53: post t1_twist_tra_dyn-copy cent_sub_ttc=254.0s · axial · 3.5mm · 0.83mm/px · 1 of 20 slices shown]
[im 1/20]
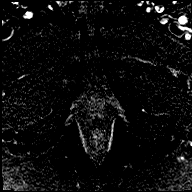

[Series 54: post t1_twist_tra_dyn-copy center · axial · 3.5mm · 0.83mm/px · 1 of 20 slices shown (22 of 24)]
[im 1/20]
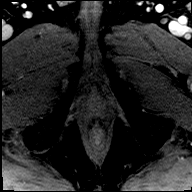

[Series 55: post t1_twist_tra_dyn-copy cent_sub_ttc=266.0s · axial · 3.5mm · 0.83mm/px · 1 of 20 slices shown]
[im 1/20]
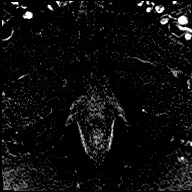

[Series 56: post t1_twist_tra_dyn-copy center · axial · 3.5mm · 0.83mm/px · 1 of 20 slices shown (23 of 24)]
[im 1/20]
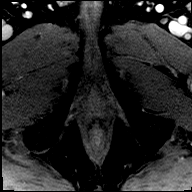

[Series 57: post t1_twist_tra_dyn-copy cent_sub_ttc=277.9s · axial · 3.5mm · 0.83mm/px · 1 of 20 slices shown]
[im 1/20]
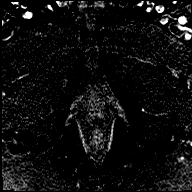

[Series 58: post t1_twist_tra_dyn-copy center · axial · 3.5mm · 0.83mm/px · 1 of 20 slices shown (24 of 24)]
[im 1/20]
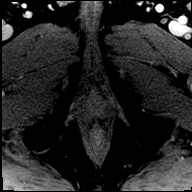

[56 of 56 positions shown; findings below may reference images not displayed]

FINDINGS: Prostate: Well-circumscribed round lesion in the LEFT mid gland
measures 5 mm (image 13, series 7). This lesion mildly restricts
diffusion (image 12, series 11). Lesion demonstrates post-contrast
enhancement (series 24).

Lesion within the RIGHT lateral apex on T2 weighted imaging measures
6 mm (image 17, series 7). This lesion also has mild restricted
diffusion (image 15, series 11). Enhancement at this site also on
series 24).

The transitional zone is nodular with well capsulated nodules.

The prostatic capsule is intact the neurovascular bundles are
normal. Seminal vesicles are normal.

Volume: Gland measures 5.5 by 5.1 x 4.9 cm (volume = 72 cm^3)

Transcapsular spread:  Absent

Seminal vesicle involvement: Absent

Neurovascular bundle involvement: Absent

Pelvic adenopathy: Absent

Bone metastasis: 12 mm lesion in the posterior aspect of the RIGHT
iliac bone (image 8, series 3) is low signal intensity on T1
weighted imaging and hyperintense on the TruFISP imaging. This
lesion is indeterminate

Other findings: None
IMPRESSION: 1. Small PI-RADS 4 lesion in the LEFT mid gland.
2. Small PI-RADS 4 lesion in the RIGHT lateral apex.
3. No extracapsular extension.
4. Indeterminate lesion in the posterior aspect of the RIGHT iliac
bone. Favor benign lesion. Depending on the biopsy pathology, lesion
could be investigated further with contrast MRI or CT or bone scan.

## 2017-05-14 MED ORDER — GADOBENATE DIMEGLUMINE 529 MG/ML IV SOLN: 20.0000 mL | Freq: Once | INTRAVENOUS | Status: DC | PRN

## 2017-05-19 ENCOUNTER — Encounter: Payer: Self-pay | Admitting: Emergency Medicine

## 2017-05-19 ENCOUNTER — Ambulatory Visit (INDEPENDENT_AMBULATORY_CARE_PROVIDER_SITE_OTHER): Payer: Medicare Other | Admitting: Emergency Medicine

## 2017-05-19 VITALS — BP 121/74 | HR 95 | Temp 98.5°F | Resp 16 | Ht 71.0 in | Wt 256.6 lb

## 2017-05-19 DIAGNOSIS — L089 Local infection of the skin and subcutaneous tissue, unspecified: Secondary | ICD-10-CM | POA: Diagnosis not present

## 2017-05-19 DIAGNOSIS — T148XXA Other injury of unspecified body region, initial encounter: Secondary | ICD-10-CM | POA: Diagnosis not present

## 2017-05-19 MED ORDER — MUPIROCIN CALCIUM 2 % EX CREA: 1.0000 "application " | TOPICAL_CREAM | Freq: Two times a day (BID) | CUTANEOUS | 0 refills | Status: DC

## 2017-05-19 MED ORDER — DOXYCYCLINE HYCLATE 100 MG PO TABS: 100.0000 mg | ORAL_TABLET | Freq: Two times a day (BID) | ORAL | 0 refills | Status: AC

## 2017-05-19 NOTE — Progress Notes (Signed)
Harold Corning Sr. 66 y.o.   Chief Complaint  Patient presents with  . Blister    RIGHT knee x 2 weeks-per patient it burst and it smelled    HISTORY OF PRESENT ILLNESS: This is a 66 y.o. male complaining of infected blister to right knee; denies injury or burn. States fluid smells bad.  HPI   Prior to Admission medications   Medication Sig Start Date End Date Taking? Authorizing Provider  amLODipine (NORVASC) 10 MG tablet Take 10 mg by mouth daily.   Yes [provider]  Cholecalciferol (VITAMIN D3) 3000 UNITS TABS Take 5,000 Units by mouth daily.   Yes [provider]  furosemide (LASIX) 20 MG tablet Take 20 mg by mouth daily.    Yes [provider]  Multiple Vitamins-Minerals (MENS MULTIVITAMIN PLUS PO) Take by mouth daily.   Yes [provider]  Omega-3 Fatty Acids (OMEGA 3 PO) Take by mouth daily.   Yes [provider]  valsartan (DIOVAN) 40 MG tablet Take 160 mg by mouth 2 (two) times daily.    Yes [provider]  doxycycline (VIBRAMYCIN) 100 MG capsule Take 1 capsule (100 mg total) by mouth 2 (two) times daily. Patient not taking: Reported on 05/19/2017 09/04/16   McVey, Gelene Mink, PA-C  HYDROcodone-acetaminophen (NORCO) 5-325 MG per tablet Take 1 tablet by mouth every 6 (six) hours as needed.    [provider]  traMADol (ULTRAM) 50 MG tablet Take 1 tablet (50 mg total) by mouth every 8 (eight) hours as needed. Patient not taking: Reported on 05/19/2017 09/04/16   McVey, Gelene Mink, PA-C    No Known Allergies  There are no active problems to display for this patient.   Past Medical History:  Diagnosis Date  . Back pain   . Hypertension     Past Surgical History:  Procedure Laterality Date  . KNEE SURGERY     left knee  . TONSILLECTOMY      Social History   Social History  . Marital status: Married    Spouse name: N/A  . Number of children: N/A  . Years of education: N/A    Occupational History  . Not on file.   Social History Main Topics  . Smoking status: Former Smoker    Quit date: 08/31/1983  . Smokeless tobacco: Never Used  . Alcohol use No  . Drug use: No  . Sexual activity: Not on file   Other Topics Concern  . Not on file   Social History Narrative  . No narrative on file    Family History  Problem Relation Age of Onset  . Dementia Mother      Review of Systems  Constitutional: Negative.  Negative for chills and fever.  Respiratory: Negative for cough and shortness of breath.   Cardiovascular: Negative for chest pain and palpitations.  Gastrointestinal: Negative for abdominal pain, diarrhea, nausea and vomiting.  Musculoskeletal: Negative for joint pain and myalgias.  Skin: Negative for rash.  Neurological: Negative for dizziness and headaches.  Endo/Heme/Allergies: Negative.   All other systems reviewed and are negative.  Vitals:   05/19/17 0935  BP: 121/74  Pulse: 95  Resp: 16  Temp: 98.5 F (36.9 C)     Physical Exam  Constitutional: He is oriented to person, place, and time. He appears well-developed and well-nourished.  HENT:  Head: Normocephalic and atraumatic.  Eyes: Pupils are equal, round, and reactive to light.  Neck: Normal range of motion.  Cardiovascular:  Normal rate and regular rhythm.   Pulmonary/Chest: Effort normal.  Neurological: He is alert and oriented to person, place, and time. No sensory deficit. He exhibits normal muscle tone.  Skin: Skin is warm and dry. Capillary refill takes less than 2 seconds.  Right knee: +blister with purulent fluid inside; opened, drained, and cultured.  Vitals reviewed.    ASSESSMENT & PLAN: Brysten was seen today for blister.  Diagnoses and all orders for this visit:  Infected blister Comments: right knee Orders: -     WOUND CULTURE  Other orders -     mupirocin cream (BACTROBAN) 2 %; Apply 1 application topically 2 (two) times daily. -      doxycycline (VIBRA-TABS) 100 MG tablet; Take 1 tablet (100 mg total) by mouth 2 (two) times daily.    Patient Instructions       IF you received an x-ray today, you will receive an invoice from [facility]. Please contact [facility] at [phone] with questions or concerns regarding your invoice.   IF you received labwork today, you will receive an invoice from [facility]. Please contact [facility] at [phone] with questions or concerns regarding your invoice.   Our billing staff will not be able to assist you with questions regarding bills from these companies.  You will be contacted with the lab results as soon as they are available. The fastest way to get your results is to activate your My Chart account. Instructions are located on the last page of this paperwork. If you have not heard from [place] regarding the results in 2 weeks, please contact this office.     Wound Care, Adult Taking care of your wound properly can help to prevent pain and infection. It can also help your wound to heal more quickly. How is this treated? Wound care  Follow instructions from your health care provider about how to take care of your wound. Make sure you: ? Wash your hands with soap and water before you change the bandage (dressing). If soap and water are not available, use hand sanitizer. ? Change your dressing as told by your health care provider. ? Leave stitches (sutures), skin glue, or adhesive strips in place. These skin closures may need to stay in place for 2 weeks or longer. If adhesive strip edges start to loosen and curl up, you may trim the loose edges. Do not remove adhesive strips completely unless your health care provider tells you to do that.  Check your wound area every day for signs of infection. Check for: ? More redness, swelling, or pain. ? More fluid or blood. ? Warmth. ? Pus or a bad smell.  Ask your health care provider if you should clean the wound with  mild soap and water. Doing this may include: ? Using a clean towel to pat the wound dry after cleaning it. Do not rub or scrub the wound. ? Applying a cream or ointment. Do this only as told by your health care provider. ? Covering the incision with a clean dressing.  Ask your health care provider when you can leave the wound uncovered. Medicines   If you were prescribed an antibiotic medicine, cream, or ointment, take or use the antibiotic as told by your health care provider. Do not stop taking or using the antibiotic even if your condition improves.  Take over-the-counter and prescription medicines only as told by your health care provider. If you were prescribed pain medicine, take it at least 30 minutes before doing  any wound care or as told by your health care provider. General instructions  Return to your normal activities as told by your health care provider. Ask your health care provider what activities are safe.  Do not scratch or pick at the wound.  Keep all follow-up visits as told by your health care provider. This is important.  Eat a diet that includes protein, vitamin A, vitamin C, and other nutrient-rich foods. These help the wound heal: ? Protein-rich foods include meat, dairy, beans, nuts, and other sources. ? Vitamin A-rich foods include carrots and dark green, leafy vegetables. ? Vitamin C-rich foods include citrus, tomatoes, and other fruits and vegetables. ? Nutrient-rich foods have protein, carbohydrates, fat, vitamins, or minerals. Eat a variety of healthy foods including vegetables, fruits, and whole grains. Contact a health care provider if:  You received a tetanus shot and you have swelling, severe pain, redness, or bleeding at the injection site.  Your pain is not controlled with medicine.  You have more redness, swelling, or pain around the wound.  You have more fluid or blood coming from the wound.  Your wound feels warm to the touch.  You have pus  or a bad smell coming from the wound.  You have a fever or chills.  You are nauseous or you vomit.  You are dizzy. Get help right away if:  You have a red streak going away from your wound.  The edges of the wound open up and separate.  Your wound is bleeding and the bleeding does not stop with gentle pressure.  You have a rash.  You faint.  You have trouble breathing. This information is not intended to replace advice given to you by your health care provider. Make sure you discuss any questions you have with your health care provider. Document Released: 07/09/2008 Document Revised: 05/29/2016 Document Reviewed: 04/16/2016 Elsevier Interactive Patient Education  2017 Elsevier Inc.      Agustina Caroli, MD [facility]

## 2017-05-19 NOTE — Patient Instructions (Addendum)
   IF you received an x-ray today, you will receive an invoice from [facility]. Please contact [facility] at [phone] with questions or concerns regarding your invoice.   IF you received labwork today, you will receive an invoice from [facility]. Please contact [facility] at [phone] with questions or concerns regarding your invoice.   Our billing staff will not be able to assist you with questions regarding bills from these companies.  You will be contacted with the lab results as soon as they are available. The fastest way to get your results is to activate your My Chart account. Instructions are located on the last page of this paperwork. If you have not heard from us regarding the results in 2 weeks, please contact this office.     Wound Care, Adult Taking care of your wound properly can help to prevent pain and infection. It can also help your wound to heal more quickly. How is this treated? Wound care  Follow instructions from your health care provider about how to take care of your wound. Make sure you: ? Wash your hands with soap and water before you change the bandage (dressing). If soap and water are not available, use hand sanitizer. ? Change your dressing as told by your health care provider. ? Leave stitches (sutures), skin glue, or adhesive strips in place. These skin closures may need to stay in place for 2 weeks or longer. If adhesive strip edges start to loosen and curl up, you may trim the loose edges. Do not remove adhesive strips completely unless your health care provider tells you to do that.  Check your wound area every day for signs of infection. Check for: ? More redness, swelling, or pain. ? More fluid or blood. ? Warmth. ? Pus or a bad smell.  Ask your health care provider if you should clean the wound with mild soap and water. Doing this may include: ? Using a clean towel to pat the wound dry after cleaning it. Do not rub or scrub  the wound. ? Applying a cream or ointment. Do this only as told by your health care provider. ? Covering the incision with a clean dressing.  Ask your health care provider when you can leave the wound uncovered. Medicines   If you were prescribed an antibiotic medicine, cream, or ointment, take or use the antibiotic as told by your health care provider. Do not stop taking or using the antibiotic even if your condition improves.  Take over-the-counter and prescription medicines only as told by your health care provider. If you were prescribed pain medicine, take it at least 30 minutes before doing any wound care or as told by your health care provider. General instructions  Return to your normal activities as told by your health care provider. Ask your health care provider what activities are safe.  Do not scratch or pick at the wound.  Keep all follow-up visits as told by your health care provider. This is important.  Eat a diet that includes protein, vitamin A, vitamin C, and other nutrient-rich foods. These help the wound heal: ? Protein-rich foods include meat, dairy, beans, nuts, and other sources. ? Vitamin A-rich foods include carrots and dark green, leafy vegetables. ? Vitamin C-rich foods include citrus, tomatoes, and other fruits and vegetables. ? Nutrient-rich foods have protein, carbohydrates, fat, vitamins, or minerals. Eat a variety of healthy foods including vegetables, fruits, and whole grains. Contact a health care provider if:  You received a   tetanus shot and you have swelling, severe pain, redness, or bleeding at the injection site.  Your pain is not controlled with medicine.  You have more redness, swelling, or pain around the wound.  You have more fluid or blood coming from the wound.  Your wound feels warm to the touch.  You have pus or a bad smell coming from the wound.  You have a fever or chills.  You are nauseous or you vomit.  You are dizzy. Get  help right away if:  You have a red streak going away from your wound.  The edges of the wound open up and separate.  Your wound is bleeding and the bleeding does not stop with gentle pressure.  You have a rash.  You faint.  You have trouble breathing. This information is not intended to replace advice given to you by your health care provider. Make sure you discuss any questions you have with your health care provider. Document Released: 07/09/2008 Document Revised: 05/29/2016 Document Reviewed: 04/16/2016 Elsevier Interactive Patient Education  2017 Elsevier Inc.  

## 2017-05-22 ENCOUNTER — Encounter: Payer: Self-pay | Admitting: Radiology

## 2017-05-22 DIAGNOSIS — H25013 Cortical age-related cataract, bilateral: Secondary | ICD-10-CM | POA: Diagnosis not present

## 2017-05-22 DIAGNOSIS — H5203 Hypermetropia, bilateral: Secondary | ICD-10-CM | POA: Diagnosis not present

## 2017-05-22 DIAGNOSIS — H524 Presbyopia: Secondary | ICD-10-CM | POA: Diagnosis not present

## 2017-05-22 DIAGNOSIS — H2513 Age-related nuclear cataract, bilateral: Secondary | ICD-10-CM | POA: Diagnosis not present

## 2017-05-22 LAB — WOUND CULTURE

## 2017-05-28 DIAGNOSIS — H25013 Cortical age-related cataract, bilateral: Secondary | ICD-10-CM | POA: Diagnosis not present

## 2017-05-28 DIAGNOSIS — H2513 Age-related nuclear cataract, bilateral: Secondary | ICD-10-CM | POA: Diagnosis not present

## 2017-06-04 DIAGNOSIS — H25012 Cortical age-related cataract, left eye: Secondary | ICD-10-CM | POA: Diagnosis not present

## 2017-06-04 DIAGNOSIS — H25011 Cortical age-related cataract, right eye: Secondary | ICD-10-CM | POA: Diagnosis not present

## 2017-06-04 DIAGNOSIS — H2511 Age-related nuclear cataract, right eye: Secondary | ICD-10-CM | POA: Diagnosis not present

## 2017-06-04 DIAGNOSIS — H2512 Age-related nuclear cataract, left eye: Secondary | ICD-10-CM | POA: Diagnosis not present

## 2017-06-11 DIAGNOSIS — H25012 Cortical age-related cataract, left eye: Secondary | ICD-10-CM | POA: Diagnosis not present

## 2017-06-11 DIAGNOSIS — H2512 Age-related nuclear cataract, left eye: Secondary | ICD-10-CM | POA: Diagnosis not present

## 2017-06-26 DIAGNOSIS — R972 Elevated prostate specific antigen [PSA]: Secondary | ICD-10-CM | POA: Diagnosis not present

## 2017-07-20 ENCOUNTER — Emergency Department (HOSPITAL_COMMUNITY): Payer: Medicare Other

## 2017-07-20 ENCOUNTER — Inpatient Hospital Stay (HOSPITAL_COMMUNITY)
Admission: EM | Admit: 2017-07-20 | Discharge: 2017-07-22 | DRG: 638 | Disposition: A | Discharge status: Home / Self Care | Payer: Medicare Other | Attending: Internal Medicine | Admitting: Internal Medicine

## 2017-07-20 ENCOUNTER — Encounter (HOSPITAL_COMMUNITY): Payer: Self-pay | Admitting: *Deleted

## 2017-07-20 DIAGNOSIS — Z87891 Personal history of nicotine dependence: Secondary | ICD-10-CM

## 2017-07-20 DIAGNOSIS — E1100 Type 2 diabetes mellitus with hyperosmolarity without nonketotic hyperglycemic-hyperosmolar coma (NKHHC): Principal | ICD-10-CM | POA: Diagnosis present

## 2017-07-20 DIAGNOSIS — E871 Hypo-osmolality and hyponatremia: Secondary | ICD-10-CM | POA: Diagnosis not present

## 2017-07-20 DIAGNOSIS — R739 Hyperglycemia, unspecified: Secondary | ICD-10-CM

## 2017-07-20 DIAGNOSIS — E1110 Type 2 diabetes mellitus with ketoacidosis without coma: Secondary | ICD-10-CM | POA: Insufficient documentation

## 2017-07-20 DIAGNOSIS — E119 Type 2 diabetes mellitus without complications: Secondary | ICD-10-CM

## 2017-07-20 DIAGNOSIS — E1165 Type 2 diabetes mellitus with hyperglycemia: Secondary | ICD-10-CM | POA: Diagnosis not present

## 2017-07-20 DIAGNOSIS — I10 Essential (primary) hypertension: Secondary | ICD-10-CM | POA: Diagnosis not present

## 2017-07-20 DIAGNOSIS — E860 Dehydration: Secondary | ICD-10-CM | POA: Diagnosis present

## 2017-07-20 DIAGNOSIS — Z79899 Other long term (current) drug therapy: Secondary | ICD-10-CM

## 2017-07-20 DIAGNOSIS — R9431 Abnormal electrocardiogram [ECG] [EKG]: Secondary | ICD-10-CM | POA: Diagnosis not present

## 2017-07-20 DIAGNOSIS — R531 Weakness: Secondary | ICD-10-CM | POA: Diagnosis not present

## 2017-07-20 LAB — CBC
HCT: 45.1 % (ref 39.0–52.0)
Hemoglobin: 15.7 g/dL (ref 13.0–17.0)
MCH: 30.5 pg (ref 26.0–34.0)
MCHC: 34.8 g/dL (ref 30.0–36.0)
MCV: 87.7 fL (ref 78.0–100.0)
PLATELETS: 226 10*3/uL (ref 150–400)
RBC: 5.14 MIL/uL (ref 4.22–5.81)
RDW: 11.9 % (ref 11.5–15.5)
WBC: 6 10*3/uL (ref 4.0–10.5)

## 2017-07-20 LAB — BASIC METABOLIC PANEL
Anion gap: 12 (ref 5–15)
Anion gap: 8 (ref 5–15)
Anion gap: 8 (ref 5–15)
Anion gap: 8 (ref 5–15)
BUN: 19 mg/dL (ref 6–20)
BUN: 17 mg/dL (ref 6–20)
BUN: 15 mg/dL (ref 6–20)
BUN: 13 mg/dL (ref 6–20)
CALCIUM: 10 mg/dL (ref 8.9–10.3)
CALCIUM: 9.8 mg/dL (ref 8.9–10.3)
CALCIUM: 9.9 mg/dL (ref 8.9–10.3)
CO2: 23 mmol/L (ref 22–32)
CO2: 27 mmol/L (ref 22–32)
CO2: 29 mmol/L (ref 22–32)
CO2: 28 mmol/L (ref 22–32)
CREATININE: 1.27 mg/dL — AB (ref 0.61–1.24)
CREATININE: 1.14 mg/dL (ref 0.61–1.24)
CREATININE: 1.12 mg/dL (ref 0.61–1.24)
CREATININE: 0.97 mg/dL (ref 0.61–1.24)
Calcium: 9.5 mg/dL (ref 8.9–10.3)
Chloride: 90 mmol/L — ABNORMAL LOW (ref 101–111)
Chloride: 95 mmol/L — ABNORMAL LOW (ref 101–111)
Chloride: 99 mmol/L — ABNORMAL LOW (ref 101–111)
Chloride: 103 mmol/L (ref 101–111)
GFR calc Af Amer: >60 mL/min (ref >60)
GFR calc Af Amer: >60 mL/min (ref >60)
GFR calc Af Amer: >60 mL/min (ref >60)
GFR calc non Af Amer: 56 mL/min — ABNORMAL LOW (ref >60)
GLUCOSE: 298 mg/dL — AB (ref 65–99)
GLUCOSE: 109 mg/dL — AB (ref 65–99)
Glucose, Bld: 905 mg/dL (ref 65–99)
Glucose, Bld: 656 mg/dL (ref 65–99)
POTASSIUM: 3.8 mmol/L (ref 3.5–5.1)
Potassium: 4.6 mmol/L (ref 3.5–5.1)
Potassium: 3.8 mmol/L (ref 3.5–5.1)
Potassium: 3.3 mmol/L — ABNORMAL LOW (ref 3.5–5.1)
SODIUM: 125 mmol/L — AB (ref 135–145)
SODIUM: 130 mmol/L — AB (ref 135–145)
SODIUM: 136 mmol/L (ref 135–145)
SODIUM: 139 mmol/L (ref 135–145)

## 2017-07-20 LAB — URINALYSIS, ROUTINE W REFLEX MICROSCOPIC
BACTERIA UA: NONE SEEN
Bilirubin Urine: NEGATIVE
Glucose, UA: >=500 mg/dL — AB
Ketones, ur: NEGATIVE mg/dL
Leukocytes, UA: NEGATIVE
Nitrite: NEGATIVE
PH: 6 (ref 5.0–8.0)
Protein, ur: NEGATIVE mg/dL
RBC / HPF: NONE SEEN RBC/hpf (ref 0–5)
SPECIFIC GRAVITY, URINE: 1.028 (ref 1.005–1.030)
Squamous Epithelial / LPF: NONE SEEN
WBC, UA: NONE SEEN WBC/hpf (ref 0–5)

## 2017-07-20 LAB — CBG MONITORING, ED
GLUCOSE-CAPILLARY: 582 mg/dL — AB (ref 65–99)
GLUCOSE-CAPILLARY: 452 mg/dL — AB (ref 65–99)
Glucose-Capillary: 371 mg/dL — ABNORMAL HIGH (ref 65–99)

## 2017-07-20 LAB — GLUCOSE, CAPILLARY
GLUCOSE-CAPILLARY: 114 mg/dL — AB (ref 65–99)
GLUCOSE-CAPILLARY: 253 mg/dL — AB (ref 65–99)
Glucose-Capillary: 87 mg/dL (ref 65–99)

## 2017-07-20 IMAGING — DX DG CHEST 1V PORT
1 series · 1 of 1 positions shown · non-contrast
Comparison: Chest x-ray dated [DATE].

CLINICAL DATA: Weakness.

EXAM:
PORTABLE CHEST 1 VIEW

[chest]
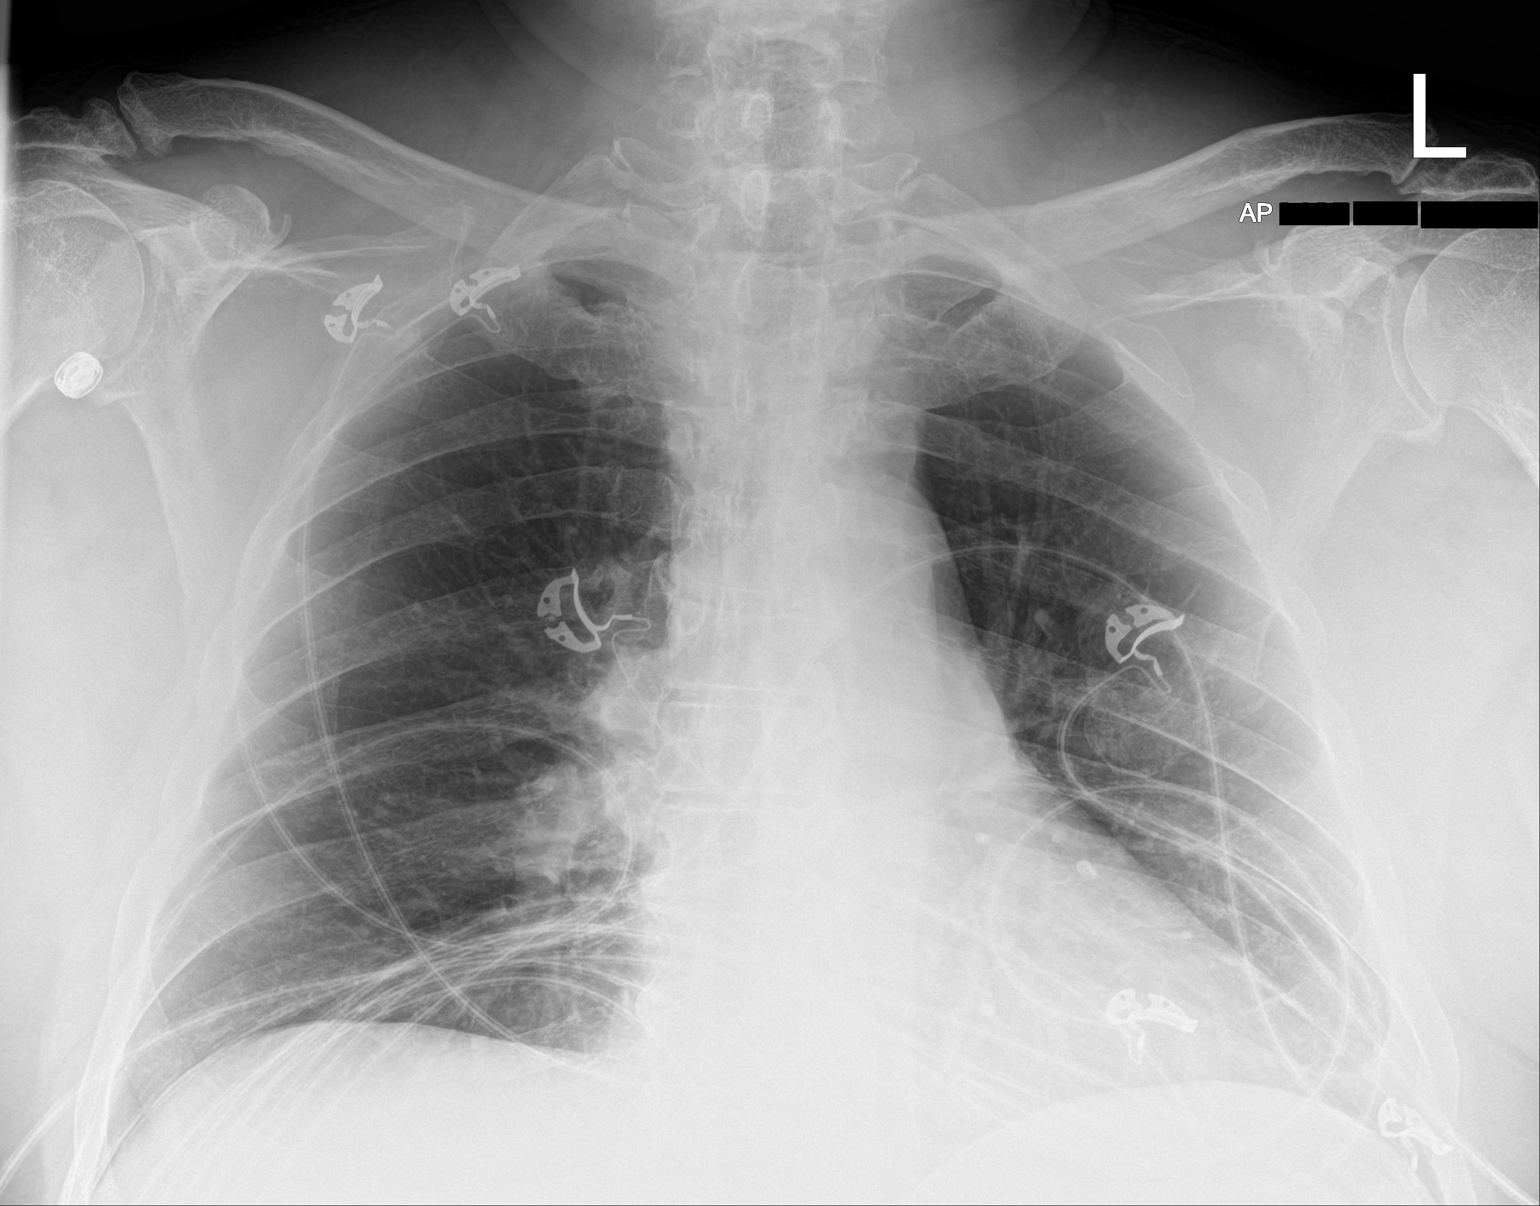

[1 of 1 positions shown; findings below may reference images not displayed]

FINDINGS: The cardiomediastinal silhouette is normal in size. Normal pulmonary
vascularity. No focal consolidation, pleural effusion, or
pneumothorax. No acute osseous abnormality.
IMPRESSION: No active disease.

## 2017-07-20 MED ORDER — AMLODIPINE BESYLATE 10 MG PO TABS
10.0000 mg | ORAL_TABLET | Freq: Every day | ORAL | Status: DC
  Administered 2017-07-20: 10 mg via ORAL
  Filled 2017-07-20: qty 1

## 2017-07-20 MED ORDER — DEXTROSE-NACL 5-0.45 % IV SOLN
INTRAVENOUS | Status: DC
  Administered 2017-07-20: 21:00:00 via INTRAVENOUS

## 2017-07-20 MED ORDER — IRBESARTAN 150 MG PO TABS
150.0000 mg | ORAL_TABLET | Freq: Every day | ORAL | Status: DC
Start: 2017-07-21 — End: 2017-07-22
  Administered 2017-07-21 – 2017-07-22 (×2): 150 mg via ORAL
  Filled 2017-07-20 (×2): qty 1

## 2017-07-20 MED ORDER — FUROSEMIDE 20 MG PO TABS: 20.0000 mg | ORAL_TABLET | Freq: Every day | ORAL | Status: DC

## 2017-07-20 MED ORDER — DEXTROSE-NACL 5-0.45 % IV SOLN: INTRAVENOUS | Status: DC

## 2017-07-20 MED ORDER — SODIUM CHLORIDE 0.9 % IV SOLN
INTRAVENOUS | Status: DC
  Administered 2017-07-20: 14:00:00 via INTRAVENOUS

## 2017-07-20 MED ORDER — INSULIN REGULAR HUMAN 100 UNIT/ML IJ SOLN
INTRAMUSCULAR | Status: DC
  Administered 2017-07-20: 5.4 [IU]/h via INTRAVENOUS
  Filled 2017-07-20: qty 1

## 2017-07-20 MED ORDER — OMEGA 3 1000 MG PO CAPS
1.0000 | ORAL_CAPSULE | Freq: Every day | ORAL | Status: DC
  Filled 2017-07-20 (×2): qty 1

## 2017-07-20 MED ORDER — SODIUM CHLORIDE 0.9 % IV BOLUS (SEPSIS)
1000.0000 mL | Freq: Once | INTRAVENOUS | Status: AC
  Administered 2017-07-20: 1000 mL via INTRAVENOUS

## 2017-07-20 MED ORDER — POTASSIUM CHLORIDE 10 MEQ/100ML IV SOLN
10.0000 meq | INTRAVENOUS | Status: AC
  Administered 2017-07-20 (×2): 10 meq via INTRAVENOUS
  Filled 2017-07-20 (×2): qty 100

## 2017-07-20 MED ORDER — HEPARIN SODIUM (PORCINE) 5000 UNIT/ML IJ SOLN
5000.0000 [IU] | Freq: Three times a day (TID) | INTRAMUSCULAR | Status: DC
  Administered 2017-07-20 – 2017-07-22 (×6): 5000 [IU] via SUBCUTANEOUS
  Filled 2017-07-20 (×5): qty 1

## 2017-07-20 MED ORDER — VITAMIN D 1000 UNITS PO TABS
5000.0000 [IU] | ORAL_TABLET | Freq: Every day | ORAL | Status: DC
  Administered 2017-07-21 – 2017-07-22 (×2): 5000 [IU] via ORAL
  Filled 2017-07-20 (×2): qty 5

## 2017-07-20 MED ORDER — HYDROCODONE-ACETAMINOPHEN 5-325 MG PO TABS: 1.0000 | ORAL_TABLET | Freq: Four times a day (QID) | ORAL | Status: DC | PRN

## 2017-07-20 MED ORDER — SODIUM CHLORIDE 0.9 % IV SOLN
INTRAVENOUS | Status: DC
  Administered 2017-07-21 (×2): via INTRAVENOUS

## 2017-07-20 NOTE — ED Notes (Signed)
Pt states he has been feeling "weird" and has been laying around more than usual for the past week. He feels weak and/or dizzy with position changes. Pt provided with ice chips, family bedside.

## 2017-07-20 NOTE — Progress Notes (Signed)
Blood sugars are not crossing over 1939 was 243  [phone]

## 2017-07-20 NOTE — ED Notes (Signed)
Tech in to check blood glucose, family bedside, lights dimmed for pt comfort.

## 2017-07-20 NOTE — ED Provider Notes (Signed)
[facility] DEPT Provider Note   CSN: [id number] Arrival date & time: 07/20/17  1235     History   Chief Complaint Chief Complaint  Patient presents with  . Urinary Frequency    HPI Harold [name]. is a 66 y.o. male.  Patient with at least 2 week history of increased thirst and urinary frequency. No fevers no abdominal pain no nausea or vomiting. No history of similar symptoms. No history of diabetes. No chest pain no shortness of breath. Patient's past medical history significant for hypertension.      Past Medical History:  Diagnosis Date  . Back pain   . Hypertension     Patient Active Problem List   Diagnosis Date Noted  . Hypertension 07/20/2017  . DKA (diabetic ketoacidoses) ([place]) 07/20/2017  . Infected blister 05/19/2017    Past Surgical History:  Procedure Laterality Date  . KNEE SURGERY     left knee  . TONSILLECTOMY         Home Medications    Prior to Admission medications   Medication Sig Start Date End Date Taking? Authorizing Provider  acetaminophen-codeine (TYLENOL #3) 300-30 MG tablet Take 1 tablet by mouth every 8 (eight) hours as needed for pain. 05/06/17  Yes [provider]  amLODipine (NORVASC) 10 MG tablet Take 10 mg by mouth daily.   Yes [provider]  Cholecalciferol (VITAMIN D3) 3000 UNITS TABS Take 5,000 Units by mouth daily.   Yes [provider]  cloNIDine (CATAPRES) 0.1 MG tablet Take 0.1 mg by mouth daily. 06/21/17  Yes [provider]  Multiple Vitamins-Minerals (MENS MULTIVITAMIN PLUS PO) Take by mouth daily.   Yes [provider]  Omega-3 Fatty Acids (OMEGA 3 PO) Take by mouth daily.   Yes [provider]  valsartan (DIOVAN) 40 MG tablet Take 160 mg by mouth daily.    Yes [provider]  mupirocin cream (BACTROBAN) 2 % Apply 1 application topically 2 (two) times daily. Patient not taking: Reported on 07/20/2017 05/19/17   Horald Pollen, MD    traMADol (ULTRAM) 50 MG tablet Take 1 tablet (50 mg total) by mouth every 8 (eight) hours as needed. Patient not taking: Reported on 05/19/2017 09/04/16   McVey, Gelene Mink, PA-C    Family History Family History  Problem Relation Age of Onset  . Dementia Mother     Social History Social History  Substance Use Topics  . Smoking status: Former Smoker    Quit date: 08/31/1983  . Smokeless tobacco: Never Used  . Alcohol use No     Allergies   Patient has no known allergies.   Review of Systems Review of Systems  Constitutional: Negative for fever.  HENT: Negative for congestion.   Eyes: Negative for redness.  Respiratory: Negative for shortness of breath.   Cardiovascular: Negative for chest pain.  Gastrointestinal: Negative for abdominal pain, nausea and vomiting.  Endocrine: Positive for polydipsia and polyuria.  Genitourinary: Positive for frequency. Negative for dysuria and hematuria.  Musculoskeletal: Negative for back pain.  Skin: Negative for rash.  Neurological: Negative for weakness, numbness and headaches.  Hematological: Does not bruise/bleed easily.  Psychiatric/Behavioral: Negative for confusion.     Physical Exam Updated Vital Signs BP (!) 141/96   Pulse 73   Temp 97.7 F (36.5 C) (Oral)   Resp 17   Ht 1.778 m (5\' 10" )   Wt 108 kg (238 lb)   SpO2 94%   BMI 34.15 kg/m   Physical  Exam  Constitutional: He is oriented to person, place, and time. He appears well-developed and well-nourished. No distress.  HENT:  Head: Normocephalic and atraumatic.  Mucous membranes dry  Eyes: Pupils are equal, round, and reactive to light. EOM are normal.  Neck: Normal range of motion. Neck supple.  Cardiovascular: Normal rate, regular rhythm and normal heart sounds.   Pulmonary/Chest: Effort normal and breath sounds normal. No respiratory distress.  Abdominal: Soft. Bowel sounds are normal. There is no tenderness.  Musculoskeletal: Normal range of motion.  He exhibits no edema.  Neurological: He is alert and oriented to person, place, and time. No cranial nerve deficit or sensory deficit. He exhibits normal muscle tone. Coordination normal.  Skin: Skin is warm. No rash noted.  Nursing note and vitals reviewed.    ED Treatments / Results  Labs (all labs ordered are listed, but only abnormal results are displayed) Labs Reviewed  URINALYSIS, ROUTINE W REFLEX MICROSCOPIC - Abnormal; Notable for the following:       Result Value   Color, Urine STRAW (*)    Glucose, UA >=500 (*)    Hgb urine dipstick MODERATE (*)    All other components within normal limits  BASIC METABOLIC PANEL - Abnormal; Notable for the following:    Sodium 125 (*)    Chloride 90 (*)    Glucose, Bld 905 (*)    Creatinine, Ser 1.27 (*)    GFR calc non Af Amer 56 (*)    All other components within normal limits  CBG MONITORING, ED - Abnormal; Notable for the following:    Glucose-Capillary >600 (*)    All other components within normal limits  CBC  BASIC METABOLIC PANEL  BASIC METABOLIC PANEL  BASIC METABOLIC PANEL  BASIC METABOLIC PANEL    EKG  EKG Interpretation  Date/Time:  Sunday July 20 2017 14:11:24 EDT Ventricular Rate:  74 PR Interval:    QRS Duration: 92 QT Interval:  373 QTC Calculation: 414 R Axis:   -43 Text Interpretation:  Sinus rhythm Left anterior fascicular block Minimal ST elevation, lateral leads Confirmed by Fredia Sorrow [phone]) on 07/20/2017 2:18:06 PM       Radiology Dg Chest Port 1 View  Result Date: 07/20/2017 CLINICAL DATA:  Weakness. EXAM: PORTABLE CHEST 1 VIEW COMPARISON:  Chest x-ray dated January 20, 2012. FINDINGS: The cardiomediastinal silhouette is normal in size. Normal pulmonary vascularity. No focal consolidation, pleural effusion, or pneumothorax. No acute osseous abnormality. IMPRESSION: No active disease. Electronically Signed   By: Titus Dubin M.D.   On: 07/20/2017 14:11    Procedures Procedures  (including critical care time)  CRITICAL CARE Performed by: Fredia Sorrow Total critical care time: 30 minutes Critical care time was exclusive of separately billable procedures and treating other patients. Critical care was necessary to treat or prevent imminent or life-threatening deterioration. Critical care was time spent personally by me on the following activities: development of treatment plan with patient and/or surrogate as well as nursing, discussions with consultants, evaluation of patient's response to treatment, examination of patient, obtaining history from patient or surrogate, ordering and performing treatments and interventions, ordering and review of laboratory studies, ordering and review of radiographic studies, pulse oximetry and re-evaluation of patient's condition.   Medications Ordered in ED Medications  insulin regular (NOVOLIN R,HUMULIN R) 100 Units in sodium chloride 0.9 % 100 mL (1 Units/mL) infusion (5.4 Units/hr Intravenous New Bag/Given 07/20/17 1421)  sodium chloride 0.9 % bolus 1,000 mL (1,000 mLs Intravenous New Bag/Given 07/20/17  1402)    And  0.9 %  sodium chloride infusion ( Intravenous New Bag/Given 07/20/17 1422)  amLODipine (NORVASC) tablet 10 mg (not administered)  cholecalciferol (VITAMIN D) tablet 5,000 Units (not administered)  Omega 3 CAPS 1,000 mg (not administered)  furosemide (LASIX) tablet 20 mg (not administered)  HYDROcodone-acetaminophen (NORCO/VICODIN) 5-325 MG per tablet 1 tablet (not administered)  irbesartan (AVAPRO) tablet 150 mg (not administered)  heparin injection 5,000 Units (not administered)  0.9 %  sodium chloride infusion (not administered)  dextrose 5 %-0.45 % sodium chloride infusion (not administered)  potassium chloride 10 mEq in 100 mL IVPB (not administered)     Initial Impression / Assessment and Plan / ED Course  I have reviewed the triage vital signs and the nursing notes.  Pertinent labs & imaging results that  were available during my care of the patient were reviewed by me and considered in my medical decision making (see chart for details).    Patient with new onset diagnosis of diabetes. Marked elevation in blood sugars in the 900 range. No evidence of diabetic ketoacidosis. Percent sugar level was so high was started on glucose stabilizer insulin drip. Per protocol. Discussed with hospitalist they will admit. Patient's primary care doctor is Darlyne Russian.  Patient also receiving IV hydration. Electrolytes without significant abnormalities other than the pseudohyponatremia expected. No evidence of metabolic acidosis.     Final Clinical Impressions(s) / ED Diagnoses   Final diagnoses:  Hyperglycemia  Diabetes mellitus, new onset ([place])    New Prescriptions New Prescriptions   No medications on file     Fredia Sorrow, MD 07/20/17 1517

## 2017-07-20 NOTE — ED Triage Notes (Signed)
Pt reports recently eating a lot of sweets. Having frequent urination for several weeks. Denies fever, nausea or pain with urination.

## 2017-07-20 NOTE — H&P (Signed)
History and Physical    Harold [name] FUX:[id number] DOB: 14-Feb-1951 DOA: 07/20/2017   PCP: Elizabeth Palau, MD (Inactive)   Patient coming from:  Home    Chief Complaint: Urinary frequency   HPI: Harold HELLBERG Sr. is a 66 y.o. male with medical history significant for HTN, who was in his usual state of health until 2 weeks ago, when he began to experience excessive thirst, and urinary frequency. The patient also reported increased hunger. Per patient report, about 1 week ago, blood was drawn for insurance purposes, which showed elevation of his blood sugar, which he was to follow in the next few weeks by his PCP. He had been seen by her last around July of this year, at which time, he reports that no blood sugar issues were noted. He denies any peripheral neuropathy. He denies any vision changes or blurred vision. He denies any fevers chills, recent infections, abdominal pain nausea or vomiting. He does not report history of similar symptoms. He denies any chest pain or shortness of breath. He has been more fatigued lately. The patient denies any history of diabetes. He is. Active. He does not smoke, drink alcohol, or use recreational drugs.   ED Course:  BP 133/77   Pulse 75   Temp 97.7 F (36.5 C) (Oral)   Resp 18   Ht 5\' 10"  (1.778 m)   Wt 108 kg (238 lb)   SpO2 95%   BMI 34.15 kg/m    On admission, his blood sugar was 905. Glucose stabilizer was initiated at the ED ->582   CO2 was 23. Overall, the patient was stable, no mental status changes were noted. He received IV bolus of fluid  Cr 1.14    Review of Systems:  As per HPI otherwise all other systems reviewed and are negative  Past Medical History:  Diagnosis Date  . Back pain   . Hypertension     Past Surgical History:  Procedure Laterality Date  . KNEE SURGERY     left knee  . TONSILLECTOMY      Social History Social History   Social History  . Marital status: Married    Spouse name:  N/A  . Number of children: N/A  . Years of education: N/A   Occupational History  . Not on file.   Social History Main Topics  . Smoking status: Former Smoker    Quit date: 08/31/1983  . Smokeless tobacco: Never Used  . Alcohol use No  . Drug use: No  . Sexual activity: Not on file   Other Topics Concern  . Not on file   Social History Narrative  . No narrative on file     No Known Allergies  Family History  Problem Relation Age of Onset  . Dementia Mother       Prior to Admission medications   Medication Sig Start Date End Date Taking? Authorizing Provider  acetaminophen-codeine (TYLENOL #3) 300-30 MG tablet Take 1 tablet by mouth every 8 (eight) hours as needed for pain. 05/06/17  Yes [provider]  amLODipine (NORVASC) 10 MG tablet Take 10 mg by mouth daily.   Yes [provider]  Cholecalciferol (VITAMIN D3) 3000 UNITS TABS Take 5,000 Units by mouth daily.   Yes [provider]  cloNIDine (CATAPRES) 0.1 MG tablet Take 0.1 mg by mouth daily. 06/21/17  Yes [provider]  Multiple Vitamins-Minerals (MENS MULTIVITAMIN PLUS PO) Take by mouth daily.   Yes [provider]  Omega-3 Fatty Acids (OMEGA 3 PO) Take by mouth daily.   Yes [provider]  valsartan (DIOVAN) 40 MG tablet Take 160 mg by mouth daily.    Yes [provider]  mupirocin cream (BACTROBAN) 2 % Apply 1 application topically 2 (two) times daily. Patient not taking: Reported on 07/20/2017 05/19/17   Horald Pollen, MD  traMADol (ULTRAM) 50 MG tablet Take 1 tablet (50 mg total) by mouth every 8 (eight) hours as needed. Patient not taking: Reported on 05/19/2017 09/04/16   Dorise Hiss, PA-C    Physical Exam:  Vitals:   07/20/17 1259 07/20/17 1430 07/20/17 1500 07/20/17 1600  BP:  (!) 141/96 (!) 151/84 133/77  Pulse:  73 72 75  Resp:  17 15 18   Temp:      TempSrc:      SpO2:  94% 96% 95%  Weight: 108 kg (238 lb)       Height: 5\' 10"  (1.778 m)      Constitutional: NAD, calm, comfortable  Eyes: PERRL, lids and conjunctivae normal ENMT: Mucous membranes are moist, without exudate or lesions  Neck: normal, supple, no masses, no thyromegaly Respiratory: clear to auscultation bilaterally, no wheezing, no crackles. Normal respiratory effort  Cardiovascular: Regular rate and rhythm,  murmur, rubs or gallops. No extremity edema. 2+ pedal pulses. No carotid bruits.  Abdomen: Soft, non tender, No hepatosplenomegaly. Bowel sounds positive.  Musculoskeletal: no clubbing / cyanosis. Moves all extremities Skin: no jaundice, No lesions.  Neurologic: Sensation intact  Strength equal in all extremities. No neuropathy noted  Psychiatric:   Alert and oriented x 3. Normal mood.     Labs on Admission: I have personally reviewed following labs and imaging studies  CBC:  Recent Labs Lab 07/20/17 1249  WBC 6.0  HGB 15.7  HCT 45.1  MCV 87.7  PLT 419    Basic Metabolic Panel:  Recent Labs Lab 07/20/17 1249 07/20/17 1525  NA 125* 130*  K 4.6 3.8  CL 90* 95*  CO2 23 27  GLUCOSE 905* 656*  BUN 19 17  CREATININE 1.27* 1.14  CALCIUM 10.0 9.8    GFR: Estimated Creatinine Clearance: 76.3 mL/min (by C-G formula based on SCr of 1.14 mg/dL).  Liver Function Tests: No results for input(s): AST, ALT, ALKPHOS, BILITOT, PROT, ALBUMIN in the last 168 hours. No results for input(s): LIPASE, AMYLASE in the last 168 hours. No results for input(s): AMMONIA in the last 168 hours.  Coagulation Profile: No results for input(s): INR, PROTIME in the last 168 hours.  Cardiac Enzymes: No results for input(s): CKTOTAL, CKMB, CKMBINDEX, TROPONINI in the last 168 hours.  BNP (last 3 results) No results for input(s): PROBNP in the last 8760 hours.  HbA1C: No results for input(s): HGBA1C in the last 72 hours.  CBG:  Recent Labs Lab 07/20/17 1244 07/20/17 1515 07/20/17 1617  GLUCAP >600* 582* 452*    Lipid  Profile: No results for input(s): CHOL, HDL, LDLCALC, TRIG, CHOLHDL, LDLDIRECT in the last 72 hours.  Thyroid Function Tests: No results for input(s): TSH, T4TOTAL, FREET4, T3FREE, THYROIDAB in the last 72 hours.  Anemia Panel: No results for input(s): VITAMINB12, FOLATE, FERRITIN, TIBC, IRON, RETICCTPCT in the last 72 hours.  Urine analysis:    Component Value Date/Time   COLORURINE STRAW (A) 07/20/2017 1247   APPEARANCEUR CLEAR 07/20/2017 1247   LABSPEC 1.028 07/20/2017 1247   PHURINE 6.0 07/20/2017 1247   GLUCOSEU >=500 (A) 07/20/2017 1247   HGBUR  MODERATE (A) 07/20/2017 1247   BILIRUBINUR NEGATIVE 07/20/2017 1247   KETONESUR NEGATIVE 07/20/2017 1247   PROTEINUR NEGATIVE 07/20/2017 1247   NITRITE NEGATIVE 07/20/2017 1247   LEUKOCYTESUR NEGATIVE 07/20/2017 1247    Sepsis Labs: @LABRCNTIP (procalcitonin:4,lacticidven:4) )No results found for this or any previous visit (from the past 240 hour(s)).   Radiological Exams on Admission: Dg Chest Port 1 View  Result Date: 07/20/2017 CLINICAL DATA:  Weakness. EXAM: PORTABLE CHEST 1 VIEW COMPARISON:  Chest x-ray dated January 20, 2012. FINDINGS: The cardiomediastinal silhouette is normal in size. Normal pulmonary vascularity. No focal consolidation, pleural effusion, or pneumothorax. No acute osseous abnormality. IMPRESSION: No active disease. Electronically Signed   By: Titus Dubin M.D.   On: 07/20/2017 14:11    EKG: Independently reviewed.  Assessment/Plan Principal Problem:   DKA (diabetic ketoacidoses) (HCC) Active Problems:   Hypertension    Diabetic Ketoacidosis, in a patient with undiagnosed DM No mental status changes   Admission Glucose was 905,  Anion Gap 12 , Bicarb 23  All other workup pending. Received IV NS , Insulin drip initiated . He is afebrile WBC normal  Admit for SDU DKA order set  ABG repeat in 6 hrs Serial lactic acid  A1C Diabetes coordinator   Hypertension BP 141/96   Pulse 73    Continue home  Norvasc all other  anti-hypertensive medications tomorrow  Add Hydralazine Q6 hours as needed for BP 160/90   Hyponatremia, Na 129 on admission, but corrected 148. No confusion Continue to monitor, will hold on hyponatremia workup for now    DVT prophylaxis:  Heparin  Code Status:    Full  Family Communication:  Discussed with patient Disposition Plan: Expect patient to be discharged to home after condition improves Consults called:    DM coordinator  Admission status: SDU    Harold Jumbo, PA-C [facility]   07/20/2017, 4:50 PM

## 2017-07-20 NOTE — ED Notes (Signed)
Radiology at bedside

## 2017-07-20 NOTE — ED Notes (Signed)
Waiting for insulin to arrive from pharmacy. 

## 2017-07-20 NOTE — Progress Notes (Signed)
Pt received from ED. CBG 253 - insulin changed, see eMAR. CHG bath complete. VSS. Telemetry applied, CCMD notified.   Fritz Pickerel, RN

## 2017-07-20 NOTE — ED Notes (Signed)
Attempting to call report at this time, IP number busy, will continue to call.

## 2017-07-21 DIAGNOSIS — E119 Type 2 diabetes mellitus without complications: Secondary | ICD-10-CM

## 2017-07-21 DIAGNOSIS — E1110 Type 2 diabetes mellitus with ketoacidosis without coma: Secondary | ICD-10-CM

## 2017-07-21 LAB — BASIC METABOLIC PANEL
Anion gap: 10 (ref 5–15)
Anion gap: 7 (ref 5–15)
BUN: 10 mg/dL (ref 6–20)
BUN: 10 mg/dL (ref 6–20)
CHLORIDE: 104 mmol/L (ref 101–111)
CO2: 26 mmol/L (ref 22–32)
CO2: 27 mmol/L (ref 22–32)
Calcium: 9.2 mg/dL (ref 8.9–10.3)
Calcium: 9.2 mg/dL (ref 8.9–10.3)
Chloride: 101 mmol/L (ref 101–111)
Creatinine, Ser: 0.97 mg/dL (ref 0.61–1.24)
Creatinine, Ser: 1 mg/dL (ref 0.61–1.24)
GFR calc Af Amer: >60 mL/min (ref >60)
GFR calc Af Amer: >60 mL/min (ref >60)
GFR calc non Af Amer: >60 mL/min (ref >60)
GLUCOSE: 107 mg/dL — AB (ref 65–99)
Glucose, Bld: 155 mg/dL — ABNORMAL HIGH (ref 65–99)
POTASSIUM: 3.1 mmol/L — AB (ref 3.5–5.1)
POTASSIUM: 3.5 mmol/L (ref 3.5–5.1)
SODIUM: 137 mmol/L (ref 135–145)
Sodium: 138 mmol/L (ref 135–145)

## 2017-07-21 LAB — GLUCOSE, CAPILLARY
GLUCOSE-CAPILLARY: 208 mg/dL — AB (ref 65–99)
GLUCOSE-CAPILLARY: 159 mg/dL — AB (ref 65–99)
GLUCOSE-CAPILLARY: 95 mg/dL (ref 65–99)
GLUCOSE-CAPILLARY: 106 mg/dL — AB (ref 65–99)
GLUCOSE-CAPILLARY: 143 mg/dL — AB (ref 65–99)
GLUCOSE-CAPILLARY: 253 mg/dL — AB (ref 65–99)
GLUCOSE-CAPILLARY: 354 mg/dL — AB (ref 65–99)
Glucose-Capillary: 125 mg/dL — ABNORMAL HIGH (ref 65–99)
Glucose-Capillary: 162 mg/dL — ABNORMAL HIGH (ref 65–99)
Glucose-Capillary: 166 mg/dL — ABNORMAL HIGH (ref 65–99)
Glucose-Capillary: 179 mg/dL — ABNORMAL HIGH (ref 65–99)
Glucose-Capillary: 268 mg/dL — ABNORMAL HIGH (ref 65–99)
Glucose-Capillary: 400 mg/dL — ABNORMAL HIGH (ref 65–99)

## 2017-07-21 LAB — HEMOGLOBIN A1C
Hgb A1c MFr Bld: 11.6 % — ABNORMAL HIGH (ref 4.8–5.6)
MEAN PLASMA GLUCOSE: 286.22 mg/dL

## 2017-07-21 MED ORDER — INSULIN GLARGINE 100 UNIT/ML ~~LOC~~ SOLN
20.0000 [IU] | Freq: Every day | SUBCUTANEOUS | Status: DC
  Administered 2017-07-21 – 2017-07-22 (×2): 20 [IU] via SUBCUTANEOUS
  Filled 2017-07-21 (×2): qty 0.2

## 2017-07-21 MED ORDER — INSULIN ASPART 100 UNIT/ML ~~LOC~~ SOLN: 0.0000 [IU] | Freq: Every day | SUBCUTANEOUS | Status: DC

## 2017-07-21 MED ORDER — SODIUM CHLORIDE 0.9 % IV SOLN
INTRAVENOUS | Status: DC
  Filled 2017-07-21: qty 1

## 2017-07-21 MED ORDER — INSULIN REGULAR BOLUS VIA INFUSION
0.0000 [IU] | Freq: Three times a day (TID) | INTRAVENOUS | Status: DC
  Filled 2017-07-21: qty 10

## 2017-07-21 MED ORDER — INSULIN STARTER KIT- SYRINGES (ENGLISH)
1.0000 | Freq: Once | Status: AC
  Administered 2017-07-21: 1
  Filled 2017-07-21: qty 1

## 2017-07-21 MED ORDER — INSULIN ASPART 100 UNIT/ML ~~LOC~~ SOLN
0.0000 [IU] | Freq: Three times a day (TID) | SUBCUTANEOUS | Status: DC
  Administered 2017-07-21 (×2): 5 [IU] via SUBCUTANEOUS
  Administered 2017-07-22 (×2): 3 [IU] via SUBCUTANEOUS

## 2017-07-21 MED ORDER — DEXTROSE 50 % IV SOLN: 25.0000 mL | INTRAVENOUS | Status: DC | PRN

## 2017-07-21 MED ORDER — INSULIN ASPART 100 UNIT/ML ~~LOC~~ SOLN
0.0000 [IU] | Freq: Every day | SUBCUTANEOUS | Status: DC
  Administered 2017-07-21: 3 [IU] via SUBCUTANEOUS

## 2017-07-21 MED ORDER — INSULIN ASPART 100 UNIT/ML ~~LOC~~ SOLN: 0.0000 [IU] | Freq: Three times a day (TID) | SUBCUTANEOUS | Status: DC

## 2017-07-21 MED ORDER — LIVING WELL WITH DIABETES BOOK
Freq: Once | Status: AC
  Administered 2017-07-21: 1
  Filled 2017-07-21 (×2): qty 1

## 2017-07-21 MED ORDER — OMEGA-3-ACID ETHYL ESTERS 1 G PO CAPS
1.0000 g | ORAL_CAPSULE | Freq: Every day | ORAL | Status: DC
  Administered 2017-07-21 – 2017-07-22 (×2): 1 g via ORAL
  Filled 2017-07-21 (×2): qty 1

## 2017-07-21 MED ORDER — DEXTROSE-NACL 5-0.45 % IV SOLN
INTRAVENOUS | Status: DC
  Administered 2017-07-21: 03:00:00 via INTRAVENOUS

## 2017-07-21 MED ORDER — INSULIN GLARGINE 100 UNIT/ML ~~LOC~~ SOLN
10.0000 [IU] | Freq: Every day | SUBCUTANEOUS | Status: DC
  Administered 2017-07-21: 10 [IU] via SUBCUTANEOUS
  Filled 2017-07-21 (×2): qty 0.1

## 2017-07-21 MED ORDER — SODIUM CHLORIDE 0.9 % IV SOLN
INTRAVENOUS | Status: DC
  Administered 2017-07-21: 03:00:00 via INTRAVENOUS

## 2017-07-21 NOTE — Progress Notes (Signed)
Pt's BS has creeped back up from 200-400 paged NP who orderd to stay on insulin gtt will continue to monitor

## 2017-07-21 NOTE — Progress Notes (Signed)
PROGRESS NOTE    Harold [name]  LEX:[id number] DOB: July 18, 1951 DOA: 07/20/2017 PCP: Elizabeth Palau, MD (Inactive)  Brief Narrative: Gagan Dillion. is a 66 y.o. male with medical history significant for HTN, who was in his usual state of health until 2 weeks ago, when he began to experience excessive thirst, and urinary frequency. Per patient report, about 1 week ago, blood was drawn for insurance purposes, which showed elevation of his blood sugar. In ED 10/7 with CBG of 905 and hyponatremia, no AG metabolic acidosis   Assessment & Plan:   Hyperosmolar, nonketotic hyperglycemia/NEw DM -no evidence of diabetic ketoacidosis -New diabetes, CBG of 905 on admission yesterday -Transition off insulin drip, will start Lantus and sliding-scale insulin -Check hemoglobin A1c, insulin teaching, diabetes coordinator consult, dietitian consult -continue IVF today  Hyponatremia -suspect a component of pseudohyponatremia due to severe hyperglycemia as well as dehydration from osmotic diuresis -Improved -continue IVF today  Hypertension -blood pressure low normal, will hold amlodipine and continue ARB today  DVT prophylaxis: Hep SQ Code Status: Full Code Family Communication: None at bedside Disposition Plan: Home in 1-2days   Subjective: -Feels better this morning, although this is a big shock to the patient  Objective: Vitals:   07/20/17 2350 07/21/17 0345 07/21/17 0400 07/21/17 0800  BP: 103/64 120/77 122/80 114/74  Pulse: 70 72 74 71  Resp: '16 13 15 12  '$ Temp: 98.6 F (37 C) 98.6 F (37 C)  98.4 F (36.9 C)  TempSrc: Oral Oral  Oral  SpO2: 91% 95% 92% 93%  Weight:      Height:        Intake/Output Summary (Last 24 hours) at 07/21/17 1107 Last data filed at 07/21/17 1000  Gross per 24 hour  Intake          1709.59 ml  Output             1150 ml  Net           559.59 ml   Filed Weights   07/20/17 1259  Weight: 108 kg (238 lb)     Examination:  General exam: Appears calm and comfortable, obese, no distress Respiratory system: Clear to auscultation. Respiratory effort normal. Cardiovascular system: S1 & S2 heard, RRR. No JVD, murmurs, rubs, gallops Gastrointestinal system: Abdomen is nondistended, soft and nontender.Normal bowel sounds heard. Central nervous system: Alert and oriented. No focal neurological deficits. Extremities: Symmetric 5 x 5 power. Skin: No rashes, lesions or ulcers Psychiatry: Judgement and insight appear normal. Mood & affect appropriate.     Data Reviewed:   CBC:  Recent Labs Lab 07/20/17 1249  WBC 6.0  HGB 15.7  HCT 45.1  MCV 87.7  PLT 449   Basic Metabolic Panel:  Recent Labs Lab 07/20/17 1525 07/20/17 1830 07/20/17 2234 07/21/17 0218 07/21/17 0548  NA 130* 136 139 137 138  K 3.8 3.8 3.3* 3.1* 3.5  CL 95* 99* 103 101 104  CO2 '27 29 28 26 27  '$ GLUCOSE 656* 298* 109* 155* 107*  BUN '17 15 13 10 10  '$ CREATININE 1.14 1.12 0.97 0.97 1.00  CALCIUM 9.8 9.9 9.5 9.2 9.2   GFR: Estimated Creatinine Clearance: 87 mL/min (by C-G formula based on SCr of 1 mg/dL). Liver Function Tests: No results for input(s): AST, ALT, ALKPHOS, BILITOT, PROT, ALBUMIN in the last 168 hours. No results for input(s): LIPASE, AMYLASE in the last 168 hours. No results for input(s): AMMONIA in the last 168 hours. Coagulation  Profile: No results for input(s): INR, PROTIME in the last 168 hours. Cardiac Enzymes: No results for input(s): CKTOTAL, CKMB, CKMBINDEX, TROPONINI in the last 168 hours. BNP (last 3 results) No results for input(s): PROBNP in the last 8760 hours. HbA1C: No results for input(s): HGBA1C in the last 72 hours. CBG:  Recent Labs Lab 07/21/17 0547 07/21/17 0658 07/21/17 0807 07/21/17 0916 07/21/17 1019  GLUCAP 106* 162* 143* 166* 179*   Lipid Profile: No results for input(s): CHOL, HDL, LDLCALC, TRIG, CHOLHDL, LDLDIRECT in the last 72 hours. Thyroid Function  Tests: No results for input(s): TSH, T4TOTAL, FREET4, T3FREE, THYROIDAB in the last 72 hours. Anemia Panel: No results for input(s): VITAMINB12, FOLATE, FERRITIN, TIBC, IRON, RETICCTPCT in the last 72 hours. Urine analysis:    Component Value Date/Time   COLORURINE STRAW (A) 07/20/2017 1247   APPEARANCEUR CLEAR 07/20/2017 1247   LABSPEC 1.028 07/20/2017 1247   PHURINE 6.0 07/20/2017 1247   GLUCOSEU >=500 (A) 07/20/2017 1247   HGBUR MODERATE (A) 07/20/2017 1247   BILIRUBINUR NEGATIVE 07/20/2017 1247   KETONESUR NEGATIVE 07/20/2017 1247   PROTEINUR NEGATIVE 07/20/2017 1247   NITRITE NEGATIVE 07/20/2017 1247   LEUKOCYTESUR NEGATIVE 07/20/2017 1247   Sepsis Labs: '@LABRCNTIP'$ (procalcitonin:4,lacticidven:4)  )No results found for this or any previous visit (from the past 240 hour(s)).       Radiology Studies: Dg Chest Port 1 View  Result Date: 07/20/2017 CLINICAL DATA:  Weakness. EXAM: PORTABLE CHEST 1 VIEW COMPARISON:  Chest x-ray dated January 20, 2012. FINDINGS: The cardiomediastinal silhouette is normal in size. Normal pulmonary vascularity. No focal consolidation, pleural effusion, or pneumothorax. No acute osseous abnormality. IMPRESSION: No active disease. Electronically Signed   By: Titus Dubin M.D.   On: 07/20/2017 14:11        Scheduled Meds: . amLODipine  10 mg Oral QHS  . cholecalciferol  5,000 Units Oral Daily  . heparin  5,000 Units Subcutaneous Q8H  . insulin aspart  0-5 Units Subcutaneous QHS  . insulin aspart  0-9 Units Subcutaneous TID WC  . insulin glargine  20 Units Subcutaneous Daily  . insulin regular  0-10 Units Intravenous TID WC  . insulin starter kit- syringes  1 kit Other Once  . irbesartan  150 mg Oral Daily  . living well with diabetes book   Does not apply Once  . omega-3 acid ethyl esters  1 g Oral Daily   Continuous Infusions: . sodium chloride Stopped (07/20/17 2054)  . insulin (NOVOLIN-R) infusion 2.4 Units/hr (07/21/17 1022)      LOS: 1 day    Time spent: 55mn    PDomenic Polite MD [facility] Page via [email or web address], password [email or web address] After 7PM please contact night-coverage  07/21/2017, 11:07 AM

## 2017-07-21 NOTE — Progress Notes (Addendum)
Inpatient Diabetes Program Recommendations  AACE/ADA: New Consensus Statement on Inpatient Glycemic Control (2015)  Target Ranges:  Prepandial:   less than 140 mg/dL      Peak postprandial:   less than 180 mg/dL (1-2 hours)      Critically ill patients:  140 - 180 mg/dL   Spoke with patient about new diabetes diagnosis.  Discussed A1C results (11.6%) and explained what an A1C is. Discussed basic pathophysiology of DM Type 2, basic home care, importance of checking CBGs and maintaining good CBG control to prevent long-term and short-term complications. Reviewed glucose and A1C goals and explained that patient will need to continue to  Reviewed signs and symptoms of hyperglycemia and hypoglycemia along with treatment for both. Discussed impact of nutrition, exercise, stress, sickness, and medications on diabetes control. When discussing Nutrition, Dietitian Joelene Millin came in and discussed CHO further. Reviewed Living Well with diabetes booklet and encouraged patient to read through entire book.  Asked patient to check his glucose at least 2 times per day (before meals and alternating second check) and to keep a log book of glucose readings and insulin taken. Explained how the doctor he follows up with can use the log book to continue to make insulin adjustments if needed.   Patient verbalized understanding of information discussed and he states that he has no further questions at this time related to diabetes.    RNs to provide ongoing basic DM education at bedside with this patient and engage patient to actively check blood glucose and administer insulin injections.     Patient and wife mentions a spider bite that turned into MRSA on his knee and they think he was on a medication taper. Unsure if this is a steroid taper.   MD Patient would benefit from ambulatory referral to Nutrition and DM center for 1:1 education.  Patient would also benefit from a basal insulin and oral agent at time of discharge.   Patient would also need a glucose meter kit (order # [id number]).  Thanks, Tama Headings RN, MSN, [facility] Inpatient Diabetes Coordinator Team Pager [phone] (8a-5p)

## 2017-07-21 NOTE — Progress Notes (Signed)
Paged NP about pt's BS and gap status transition orders given will continue to monitor

## 2017-07-21 NOTE — Progress Notes (Signed)
Nutrition Education Note  RD consulted for nutrition education regarding new onset diabetes. Spoke with patient and his wife along with the diabetes coordinator, Larene Beach.   Lab Results  Component Value Date   HGBA1C 11.6 (H) 07/21/2017    RD provided "Carbohydrate Counting for People with Diabetes" handout from the Academy of [employer] and Dietetics. Discussed different food groups and their effects on blood sugar, emphasizing carbohydrate-containing foods. Provided list of carbohydrates and recommended serving sizes of common foods.  Discussed importance of controlled and consistent carbohydrate intake throughout the day. Provided examples of ways to balance meals/snacks and encouraged intake of high-fiber, whole grain complex carbohydrates. Teach back method used.  Expect good compliance.  Body mass index is 34.15 kg/m. Pt meets criteria for obesity based on current BMI.  Current diet order is CHO modified, patient is consuming approximately 50% of meals at this time. Labs and medications reviewed. No further nutrition interventions warranted at this time. RD contact information provided. If additional nutrition issues arise, please re-consult RD.  Molli Barrows, RD, LDN, [place] Pager [phone] After Hours Pager [phone]

## 2017-07-22 DIAGNOSIS — E1100 Type 2 diabetes mellitus with hyperosmolarity without nonketotic hyperglycemic-hyperosmolar coma (NKHHC): Principal | ICD-10-CM

## 2017-07-22 LAB — BASIC METABOLIC PANEL
ANION GAP: 9 (ref 5–15)
BUN: 8 mg/dL (ref 6–20)
CO2: 24 mmol/L (ref 22–32)
Calcium: 9 mg/dL (ref 8.9–10.3)
Chloride: 105 mmol/L (ref 101–111)
Creatinine, Ser: 0.88 mg/dL (ref 0.61–1.24)
GFR calc Af Amer: >60 mL/min (ref >60)
GLUCOSE: 242 mg/dL — AB (ref 65–99)
POTASSIUM: 3.6 mmol/L (ref 3.5–5.1)
Sodium: 138 mmol/L (ref 135–145)

## 2017-07-22 LAB — CBC
HEMATOCRIT: 40.6 % (ref 39.0–52.0)
Hemoglobin: 13.9 g/dL (ref 13.0–17.0)
MCH: 30.3 pg (ref 26.0–34.0)
MCHC: 34.2 g/dL (ref 30.0–36.0)
MCV: 88.5 fL (ref 78.0–100.0)
PLATELETS: 186 10*3/uL (ref 150–400)
RBC: 4.59 MIL/uL (ref 4.22–5.81)
RDW: 11.9 % (ref 11.5–15.5)
WBC: 5.9 10*3/uL (ref 4.0–10.5)

## 2017-07-22 MED ORDER — METFORMIN HCL 500 MG PO TABS: 500.0000 mg | ORAL_TABLET | Freq: Two times a day (BID) | ORAL | 0 refills | Status: AC

## 2017-07-22 MED ORDER — BLOOD GLUCOSE METER KIT: PACK | 0 refills | Status: AC

## 2017-07-22 MED ORDER — INSULIN GLARGINE 100 UNIT/ML ~~LOC~~ SOLN: 25.0000 [IU] | Freq: Every day | SUBCUTANEOUS | 2 refills | Status: DC

## 2017-07-22 NOTE — Patient Care Conference (Signed)
patiient in a stable condition, discharge education reviewed with patient and his wife at bedside, they verbalized understanding, all diabetes education reviewed, patient had no questions, iv removed, tele dc patient belongings at bedside, patient to be transported home by his wife.

## 2017-07-22 NOTE — Discharge Summary (Signed)
Physician Discharge Summary  Harold [name] KNL:[id number] DOB: [birth year] DOA: 07/20/2017  PCP: Elizabeth Palau, MD (Inactive)  Admit date: 07/20/2017 Discharge date: 07/22/2017  Time spent: 35 minutes  Recommendations for Outpatient Follow-up:  1. Ambulatory referral to outpatient diabetes and nutrition services made 2. PCP Dr. Kennon Holter in one week-newly started on insulin for new onset diabetes/severe hyperglycemia   Discharge Diagnoses:  Principal Problem:   Diabetic hyperosmolar non-ketotic state ([place])   New Diabetes mellitus   Hyponatremia   Hypertension   dehydration  Discharge Condition: stable  Diet recommendation: carb modified  Filed Weights   07/20/17 1259  Weight: 108 kg (238 lb)    History of present illness:   Harold Mirandais a 66 y.o.malewith medical history significant for HTN, who was in his usual state of health until 2 weeks ago, when he began to experience excessive thirst, and urinary frequency. Per patient report, about 1 week ago, blood was drawn for insurance purposes, which showed elevation of his blood sugar. In ED 10/7 with CBG of 905 and hyponatremia, no AG metabolic acidosis  Hospital Course:   Hyperosmolar, nonketotic hyperglycemia/NEw DM -no evidence of diabetic ketoacidosis on admission -New diabetes, CBG of 905 on admission -initially treated with insulin drip and IV fluids subsequently transitioned off insulin drip and started on Lantus -Hemoglobin A1c was 11.6 -Diabetes coordinator consulted and insulin teaching/education ongoing, patient was able to administer himself insulin yesterday and this morning -Discharged home on Lantus 25 units daily and metformin, prescription given for insulin, glucometer/Kit -Ambulatory referral sent for outpatient diabetes and nutrition services -discharged home in a stable condition today  Hyponatremia -suspect a component of pseudohyponatremia due to severe hyperglycemia as  well as dehydration from osmotic diuresis -resolved with correction of hyperglycemia and hydration  Hypertension -blood pressures have been in the 120s range on ARB alone, stopped clonidine at discharge and resumed amlodipine  Consultations:  Diabetes coordinator  Dietitian  Discharge Exam: Vitals:   07/22/17 0749 07/22/17 0940  BP:  133/67  Pulse:  64  Resp: 16 (!) 22  Temp: 98.1 F (36.7 C)   SpO2: 93% 90%    General: alert awake oriented 3 Cardiovascular: S1-S2 regular rate rhythm Respiratory:clear bilaterally  Discharge Instructions   Discharge Instructions    Ambulatory referral to Nutrition and Diabetic Education    Complete by:  As directed    Diet - low sodium heart healthy    Complete by:  As directed    Diet Carb Modified    Complete by:  As directed    Increase activity slowly    Complete by:  As directed      Current Discharge Medication List    START taking these medications   Details  blood glucose meter kit and supplies Dispense based on patient and insurance preference. Use up to four times daily as directed. (FOR ICD-9 250.00, 250.01). Qty: 1 each, Refills: 0    insulin glargine (LANTUS) 100 UNIT/ML injection Inject 0.25 mLs (25 Units total) into the skin daily. Qty: 10 mL, Refills: 2    metFORMIN (GLUCOPHAGE) 500 MG tablet Take 1 tablet (500 mg total) by mouth 2 (two) times daily with a meal. Qty: 60 tablet, Refills: 0      CONTINUE these medications which have NOT CHANGED   Details  acetaminophen-codeine (TYLENOL #3) 300-30 MG tablet Take 1 tablet by mouth every 8 (eight) hours as needed for pain. Refills: 1    amLODipine (NORVASC) 10 MG tablet  Take 10 mg by mouth daily.    Cholecalciferol (VITAMIN D3) 3000 UNITS TABS Take 5,000 Units by mouth daily.    Multiple Vitamins-Minerals (MENS MULTIVITAMIN PLUS PO) Take by mouth daily.    Omega-3 Fatty Acids (OMEGA 3 PO) Take by mouth daily.    valsartan (DIOVAN) 40 MG tablet Take 160  mg by mouth daily.       STOP taking these medications     cloNIDine (CATAPRES) 0.1 MG tablet      mupirocin cream (BACTROBAN) 2 %      traMADol (ULTRAM) 50 MG tablet      HYDROcodone-acetaminophen (NORCO) 5-325 MG per tablet        No Known Allergies Follow-up Information    Blount, Denton Meek, MD. Schedule an appointment as soon as possible for a visit in 1 week(s).   Specialty:  Family Medicine           The results of significant diagnostics from this hospitalization (including imaging, microbiology, ancillary and laboratory) are listed below for reference.    Significant Diagnostic Studies: Dg Chest Port 1 View  Result Date: 07/20/2017 CLINICAL DATA:  Weakness. EXAM: PORTABLE CHEST 1 VIEW COMPARISON:  Chest x-ray dated January 20, 2012. FINDINGS: The cardiomediastinal silhouette is normal in size. Normal pulmonary vascularity. No focal consolidation, pleural effusion, or pneumothorax. No acute osseous abnormality. IMPRESSION: No active disease. Electronically Signed   By: Titus Dubin M.D.   On: 07/20/2017 14:11    Microbiology: No results found for this or any previous visit (from the past 240 hour(s)).   Labs: Basic Metabolic Panel:  Recent Labs Lab 07/20/17 1830 07/20/17 2234 07/21/17 0218 07/21/17 0548 07/22/17 0455  NA 136 139 137 138 138  K 3.8 3.3* 3.1* 3.5 3.6  CL 99* 103 101 104 105  CO2 _0 GLUCOSE 298* 109* 155* 107* 242*  BUN _1 CREATININE 1.12 0.97 0.97 1.00 0.88  CALCIUM 9.9 9.5 9.2 9.2 9.0   Liver Function Tests: No results for input(s): AST, ALT, ALKPHOS, BILITOT, PROT, ALBUMIN in the last 168 hours. No results for input(s): LIPASE, AMYLASE in the last 168 hours. No results for input(s): AMMONIA in the last 168 hours. CBC:  Recent Labs Lab 07/20/17 1249 07/22/17 0455  WBC 6.0 5.9  HGB 15.7 13.9  HCT 45.1 40.6  MCV 87.7 88.5  PLT 226 186   Cardiac Enzymes: No results for input(s): CKTOTAL, CKMB,  CKMBINDEX, TROPONINI in the last 168 hours. BNP: BNP (last 3 results) No results for input(s): BNP in the last 8760 hours.  ProBNP (last 3 results) No results for input(s): PROBNP in the last 8760 hours.  CBG:  Recent Labs Lab 07/21/17 0916 07/21/17 1019 07/21/17 1154 07/21/17 2103 07/21/17 2307  GLUCAP 166* 179* 253* 354* 268*       SignedDomenic Polite MD.  [facility] 07/22/2017, 10:42 AM

## 2017-07-22 NOTE — Care Management Note (Signed)
Case Management Note Marvetta Gibbons RN, BSN Unit 4E-Case Manager [phone]  Patient Details  Name: Harold RAZZANO Sr. MRN: [id number] Date of Birth: September 30, 1951  Subjective/Objective:   Pt admitted with new DM                 Action/Plan: PTA pt lived at home with spouse- plan to d/c home with wife- no CM needs noted for discharge-   Expected Discharge Date:  07/22/17               Expected Discharge Plan:  Home/Self Care  In-House Referral:  NA  Discharge planning Services  CM Consult  Post Acute Care Choice:  NA Choice offered to:  NA  DME Arranged:    DME Agency:  NA  HH Arranged:  NA HH Agency:  NA  Status of Service:  Completed, signed off  If discussed at [place] of Stay Meetings, dates discussed:    Discharge Disposition: home/self care   Additional Comments:  Dawayne Patricia, RN 07/22/2017, 11:05 AM

## 2017-07-22 NOTE — Consult Note (Signed)
[facility] CM Primary Care Navigator  07/22/2017  Harold [name] Sr. 07-19-1951 [id number]   Met with patientand wife Harold [name]) at the bedside to identify possible discharge needs. Patient reports having experienced excessive thirst, urinary frequency and blood checked which showed elevation of his blood sugar that had led to this admission.  PatientendorsesDr. Lucianne Lei with [facility] as his current primary care provider.   Patient shared using [facility] on [address] to obtain medications without difficulty.   Patient reports managing his own medications at home using "pill box" system filled weekly.  According to patient, he was driving prior to admission but wife will be able to provide transportation to his doctor's appointments after discharge if needed.  Patient verbalized that wife is the primary caregiver at home.   Anticipated discharge plan is home per patient.   Patient and wife voiced understanding to call primary care provider's office when he gets back home, for a post discharge follow-up appointment within a week or sooner if needs arise. Patient letter (with PCP's contact number) provided as their reminder.   Patient was with admitted with new onset diabetes/ severe hyperglycemia and was newly started on insulin. Explained to patient about [facility] care management services available for further health management at home and he voiced interest with it. He admits lack of knowledge about diabetes and ways in managing it. His most recent A1c is 11.6. He verbalized need for further assistance with management of newly diagnosed DM at home. He has referral to ambulatory diabetes classes and dietitian was consulted while he is inpatient.   Patientverbally agreed and opted [facility] management coordinator for further in-home evaluation and assessment of care needs and management of DM.    Referral made to [place] care management to follow-up after discharge.   For questions, please contact:  Dannielle Huh, BSN, RN- [facility] Primary Care Navigator  Telephone: [phone] [place]

## 2017-07-23 LAB — GLUCOSE, CAPILLARY
Glucose-Capillary: 254 mg/dL — ABNORMAL HIGH (ref 65–99)
Glucose-Capillary: 223 mg/dL — ABNORMAL HIGH (ref 65–99)

## 2017-07-24 ENCOUNTER — Ambulatory Visit: Payer: Medicare Other

## 2017-07-24 ENCOUNTER — Other Ambulatory Visit: Payer: Self-pay

## 2017-07-24 DIAGNOSIS — R7309 Other abnormal glucose: Secondary | ICD-10-CM | POA: Diagnosis not present

## 2017-07-24 NOTE — Patient Outreach (Signed)
Transition of care: New referral:  Discharge date 07/22/2017  Placed call to patient. No answer at home or cell. Left a message requesting a call back.  PLAN: will attempt again tomorrow.  Tomasa Rand, RN, BSN, CEN [facility] [employer] [phone]

## 2017-07-25 ENCOUNTER — Other Ambulatory Visit: Payer: Self-pay
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
## 2017-07-28 ENCOUNTER — Other Ambulatory Visit: Payer: Self-pay
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
## 2017-07-30 ENCOUNTER — Other Ambulatory Visit: Payer: Self-pay

## 2017-07-30 NOTE — Patient Outreach (Signed)
Care coordination:  Placed call to MD office to follow up on number of times patient needs to monitor his CBG.  Nurse for Dr. Criss Rosales unavailable.  Voicemail left for [place] to call me back with directions.  5pm. No call back.  Tomasa Rand, RN, BSN, CEN [facility] [employer] [phone]

## 2017-07-31 ENCOUNTER — Encounter: Payer: Self-pay | Admitting: Dietician

## 2017-07-31 ENCOUNTER — Ambulatory Visit: Payer: Medicare Other

## 2017-07-31 ENCOUNTER — Encounter: Payer: Medicare Other | Attending: Family Medicine | Admitting: Dietician

## 2017-07-31 ENCOUNTER — Other Ambulatory Visit: Payer: Self-pay

## 2017-07-31 DIAGNOSIS — Z713 Dietary counseling and surveillance: Secondary | ICD-10-CM | POA: Insufficient documentation

## 2017-07-31 DIAGNOSIS — E1165 Type 2 diabetes mellitus with hyperglycemia: Secondary | ICD-10-CM | POA: Diagnosis not present

## 2017-07-31 DIAGNOSIS — E119 Type 2 diabetes mellitus without complications: Secondary | ICD-10-CM

## 2017-07-31 NOTE — Patient Outreach (Signed)
Care Coordination: Placed call to patient to inform him that I have not heard back from MD about number of times to self monitor CBG in a day.  Encouraged patient to inquire today when he attends his office visit.  Tomasa Rand, RN, BSN, CEN [facility] [employer] [phone]

## 2017-07-31 NOTE — Progress Notes (Signed)

## 2017-08-04 ENCOUNTER — Other Ambulatory Visit: Payer: Self-pay

## 2017-08-04 NOTE — Patient Outreach (Signed)
Transition of care call:  Placed call to patient who answered. Patient reports that he is doing well. Reports he had follow up with MD last week and went to DM class. Reports insulin was decreased to 22 units.  Reports low CBG in the mornings.  States todays CBG of 69.  Reports felt woozy.  Reports after breakfast up to 102.  Reports that he was instructed to monitor CBG twice a day.  States that [facility] is walking daily [facility]   PLAN: Reviewed importance of eating a protein snack at bedtime and reports low readings to MD.  Patient voiced understanding and has a follow up MD appointment in 3 days.   Will continue weekly transition of care calls.  Tomasa Rand, RN, BSN, CEN [facility] [employer] [phone]

## 2017-08-07 ENCOUNTER — Ambulatory Visit: Payer: Medicare Other

## 2017-08-07 DIAGNOSIS — E119 Type 2 diabetes mellitus without complications: Secondary | ICD-10-CM | POA: Diagnosis not present

## 2017-08-07 DIAGNOSIS — I10 Essential (primary) hypertension: Secondary | ICD-10-CM | POA: Diagnosis not present

## 2017-08-11 ENCOUNTER — Other Ambulatory Visit: Payer: Self-pay

## 2017-08-11 NOTE — Patient Outreach (Signed)
Transition of care call: Placed call to patient for transition of care. Patient answered and identified himself. Reports that he is doing well. [facility]  Reports he is following his diet.   CBG readings as below: 08/04/2017   69, 85, 99 08/05/2017   85, 84 08/06/2017    84, 96, 97 08/07/2017    94, 97 08/08/2017   67, 109 08/09/2017    74, 73, 105 08/10/2017    67,93 08/11/2017   80  Patient reports that he can not tell when CBG level is low.  Reports that he is eating a bedtime snack of graham crackers and peanut butter.   PLAN: Will send these readings to MD to review and offer suggestions for low readings.  Reviewed with patient the importance of keeping candy with him for hypoglycemia readings.  Will continue weekly transition of care calls.  Tomasa Rand, RN, BSN, CEN [facility] [employer] [phone]

## 2017-08-19 ENCOUNTER — Other Ambulatory Visit: Payer: Self-pay

## 2017-08-20 NOTE — Patient Outreach (Signed)
Transition of care call:  08/19/2017:  Placed call to patient for transition of care. No answer at home phone. No answer from cell phone. Left a message on cell phone for patient to return call.  PLAN: will await a call back.  Tomasa Rand, RN, BSN, CEN [facility] [employer] [phone]

## 2017-08-21 DIAGNOSIS — E119 Type 2 diabetes mellitus without complications: Secondary | ICD-10-CM | POA: Diagnosis not present

## 2017-08-21 DIAGNOSIS — I10 Essential (primary) hypertension: Secondary | ICD-10-CM | POA: Diagnosis not present

## 2017-08-26 ENCOUNTER — Ambulatory Visit: Payer: Self-pay

## 2017-08-27 ENCOUNTER — Other Ambulatory Visit: Payer: Self-pay

## 2017-08-27 NOTE — Patient Outreach (Signed)
Transition of care: Placed call to patient who answered and reports that he is doing well. Reports weight loss of 24 pounds. [facility] Reports that he is monitoring CBG twice a day and recording.  Last readings are: 08/27/2017  Fasting  95 08/26/2017  Fasting  76    130 in the evening 08/25/2017   Fasting 77   91 in the evening  Reports that he is following his diet. Denies any hypoglycemic symptoms.  PLAN: discussed with patient that he has completed 30 day transition of care program without a readmission.  Reviewed with patient that I would contact him in 1 month and for him to call me sooner if needed. He agreed.  Tomasa Rand, RN, BSN, CEN [facility] [employer] [phone]

## 2017-08-28 ENCOUNTER — Encounter: Payer: Medicare Other | Attending: Family Medicine | Admitting: Dietician

## 2017-08-28 ENCOUNTER — Encounter: Payer: Self-pay | Admitting: Dietician

## 2017-08-28 DIAGNOSIS — Z713 Dietary counseling and surveillance: Secondary | ICD-10-CM | POA: Diagnosis not present

## 2017-08-28 DIAGNOSIS — E1165 Type 2 diabetes mellitus with hyperglycemia: Secondary | ICD-10-CM | POA: Insufficient documentation

## 2017-08-28 DIAGNOSIS — E119 Type 2 diabetes mellitus without complications: Secondary | ICD-10-CM

## 2017-08-28 NOTE — Progress Notes (Signed)
Patient was seen on 08/28/17 for the third of a series of three diabetes self[facility]   Catalina [place] the amount of activity recommended for healthy living . Describe activities suitable for individual needs . Identify ways to regularly incorporate activity into daily life . Identify barriers to activity and ways to over come these barriers  Identify diabetes medications being personally used and their primary action for lowering glucose and possible side effects . Describe role of stress on blood glucose and develop strategies to address psychosocial issues . Identify diabetes complications and ways to prevent them  Explain how to manage diabetes during illness . Evaluate success in meeting personal goal . Establish 2-3 goals that they will plan to diligently work on until they return for the  4-month follow-up visit  Goals:   I will count my carb choices at most meals and snacks  I will be active 60 minutes or more 4 times a week  I will take my diabetes medications as scheduled  I will eat less unhealthy fats by eating less sweets  I will test my glucose at least 2 times a day, 7 days a week  Your patient has identified these potential barriers to change:  Motivation  Your patient has identified their diabetes self-care support plan as  Family Support    Plan:  Attend Support Group as desired

## 2017-09-18 ENCOUNTER — Encounter: Payer: Medicare Other | Attending: Family Medicine | Admitting: Dietician

## 2017-09-18 DIAGNOSIS — E119 Type 2 diabetes mellitus without complications: Secondary | ICD-10-CM

## 2017-09-18 DIAGNOSIS — Z713 Dietary counseling and surveillance: Secondary | ICD-10-CM | POA: Insufficient documentation

## 2017-09-18 DIAGNOSIS — E1165 Type 2 diabetes mellitus with hyperglycemia: Secondary | ICD-10-CM | POA: Diagnosis not present

## 2017-09-18 NOTE — Progress Notes (Signed)
Patient was seen on 09/18/17 for the second of a series of three diabetes self-management courses at the [facility]. The following learning objectives were met by the patient during this class:   Describe the role of different macronutrients on glucose  Explain how carbohydrates affect blood glucose  State what foods contain the most carbohydrates  Demonstrate carbohydrate counting  Demonstrate how to read Nutrition Facts food label  Describe effects of various fats on heart health  Describe the importance of good nutrition for health and healthy eating strategies  Describe techniques for managing your shopping, cooking and meal planning  List strategies to follow meal plan when dining out  Describe the effects of alcohol on glucose and how to use it safely  Goals:  Follow Diabetes Meal Plan as instructed  Aim to spread carbs evenly throughout the day  Aim for 3 meals per day and snacks as needed Include lean protein foods to meals/snacks  Monitor glucose levels as instructed by your doctor   Follow-Up Plan:  Attend Core 3  Work towards following your personal food plan.

## 2017-09-23 ENCOUNTER — Ambulatory Visit: Payer: Medicare Other

## 2017-09-23 DIAGNOSIS — E119 Type 2 diabetes mellitus without complications: Secondary | ICD-10-CM | POA: Diagnosis not present

## 2017-09-23 DIAGNOSIS — I10 Essential (primary) hypertension: Secondary | ICD-10-CM | POA: Diagnosis not present

## 2017-09-23 DIAGNOSIS — M130 Polyarthritis, unspecified: Secondary | ICD-10-CM | POA: Diagnosis not present

## 2017-09-24 ENCOUNTER — Other Ambulatory Visit: Payer: Self-pay

## 2017-09-24 NOTE — Patient Outreach (Signed)
60 day follow up/ case closure:   Placed call to patient who reports that he is doing well. Reports CBG range of 80-120.  Reports that he continues to follow his diet and exercise. Reports he saw his primary MD yesterday and all was good. Reports that he will follow up with primary MD in 3 months. Denies any medication changes.  Reviewed goals of care with patient and he has achieved all his goals. Denies any new goals at this time. Discussed case closure with patient and he is in agreement.  Reviewed with patient to call me in the future for any concerns and he agreed.  PLAN: Close case. Goals met. Will send patient and MD a case closure letter.  Tomasa Rand, RN, BSN, CEN [facility] [employer] [phone]

## 2017-11-24 DIAGNOSIS — R972 Elevated prostate specific antigen [PSA]: Secondary | ICD-10-CM | POA: Diagnosis not present

## 2017-12-01 DIAGNOSIS — R972 Elevated prostate specific antigen [PSA]: Secondary | ICD-10-CM | POA: Diagnosis not present

## 2017-12-23 DIAGNOSIS — E119 Type 2 diabetes mellitus without complications: Secondary | ICD-10-CM | POA: Diagnosis not present

## 2017-12-23 DIAGNOSIS — I10 Essential (primary) hypertension: Secondary | ICD-10-CM | POA: Diagnosis not present

## 2017-12-23 DIAGNOSIS — M545 Low back pain: Secondary | ICD-10-CM | POA: Diagnosis not present

## 2018-03-24 DIAGNOSIS — E119 Type 2 diabetes mellitus without complications: Secondary | ICD-10-CM | POA: Diagnosis not present

## 2018-03-24 DIAGNOSIS — I10 Essential (primary) hypertension: Secondary | ICD-10-CM | POA: Diagnosis not present

## 2018-03-24 DIAGNOSIS — M549 Dorsalgia, unspecified: Secondary | ICD-10-CM | POA: Diagnosis not present

## 2018-05-26 DIAGNOSIS — R972 Elevated prostate specific antigen [PSA]: Secondary | ICD-10-CM | POA: Diagnosis not present

## 2018-07-08 DIAGNOSIS — M130 Polyarthritis, unspecified: Secondary | ICD-10-CM | POA: Diagnosis not present

## 2018-07-08 DIAGNOSIS — M549 Dorsalgia, unspecified: Secondary | ICD-10-CM | POA: Diagnosis not present

## 2018-07-08 DIAGNOSIS — M545 Low back pain: Secondary | ICD-10-CM | POA: Diagnosis not present

## 2018-07-08 DIAGNOSIS — E119 Type 2 diabetes mellitus without complications: Secondary | ICD-10-CM | POA: Diagnosis not present

## 2018-07-14 DIAGNOSIS — Z961 Presence of intraocular lens: Secondary | ICD-10-CM | POA: Diagnosis not present

## 2018-07-14 DIAGNOSIS — Z794 Long term (current) use of insulin: Secondary | ICD-10-CM | POA: Diagnosis not present

## 2018-07-14 DIAGNOSIS — E119 Type 2 diabetes mellitus without complications: Secondary | ICD-10-CM | POA: Insufficient documentation

## 2018-11-09 DIAGNOSIS — Z0000 Encounter for general adult medical examination without abnormal findings: Secondary | ICD-10-CM | POA: Diagnosis not present

## 2018-11-09 DIAGNOSIS — E119 Type 2 diabetes mellitus without complications: Secondary | ICD-10-CM | POA: Diagnosis not present

## 2018-11-09 DIAGNOSIS — M130 Polyarthritis, unspecified: Secondary | ICD-10-CM | POA: Diagnosis not present

## 2018-11-09 DIAGNOSIS — I10 Essential (primary) hypertension: Secondary | ICD-10-CM | POA: Diagnosis not present

## 2019-03-09 DIAGNOSIS — E119 Type 2 diabetes mellitus without complications: Secondary | ICD-10-CM | POA: Diagnosis not present

## 2019-03-09 DIAGNOSIS — M130 Polyarthritis, unspecified: Secondary | ICD-10-CM | POA: Diagnosis not present

## 2019-03-09 DIAGNOSIS — I10 Essential (primary) hypertension: Secondary | ICD-10-CM | POA: Diagnosis not present

## 2019-03-10 DIAGNOSIS — M545 Low back pain: Secondary | ICD-10-CM | POA: Diagnosis not present

## 2019-03-10 DIAGNOSIS — M130 Polyarthritis, unspecified: Secondary | ICD-10-CM | POA: Diagnosis not present

## 2019-03-10 DIAGNOSIS — I10 Essential (primary) hypertension: Secondary | ICD-10-CM | POA: Diagnosis not present

## 2019-03-10 DIAGNOSIS — E1169 Type 2 diabetes mellitus with other specified complication: Secondary | ICD-10-CM | POA: Diagnosis not present

## 2019-03-23 ENCOUNTER — Other Ambulatory Visit: Payer: Self-pay | Admitting: *Deleted

## 2019-03-23 DIAGNOSIS — Z20822 Contact with and (suspected) exposure to covid-19: Secondary | ICD-10-CM

## 2019-03-25 LAB — NOVEL CORONAVIRUS, NAA: SARS-CoV-2, NAA: NOT DETECTED

## 2019-03-31 DIAGNOSIS — E1169 Type 2 diabetes mellitus with other specified complication: Secondary | ICD-10-CM | POA: Diagnosis not present

## 2019-03-31 DIAGNOSIS — H9313 Tinnitus, bilateral: Secondary | ICD-10-CM | POA: Diagnosis not present

## 2019-03-31 DIAGNOSIS — I10 Essential (primary) hypertension: Secondary | ICD-10-CM | POA: Diagnosis not present

## 2019-04-13 DIAGNOSIS — R3915 Urgency of urination: Secondary | ICD-10-CM | POA: Diagnosis not present

## 2019-05-26 DIAGNOSIS — Z1159 Encounter for screening for other viral diseases: Secondary | ICD-10-CM | POA: Diagnosis not present

## 2019-07-12 DIAGNOSIS — M130 Polyarthritis, unspecified: Secondary | ICD-10-CM | POA: Diagnosis not present

## 2019-07-12 DIAGNOSIS — I10 Essential (primary) hypertension: Secondary | ICD-10-CM | POA: Diagnosis not present

## 2019-07-12 DIAGNOSIS — E1169 Type 2 diabetes mellitus with other specified complication: Secondary | ICD-10-CM | POA: Diagnosis not present

## 2019-09-15 ENCOUNTER — Encounter: Payer: Self-pay | Admitting: Gastroenterology

## 2019-09-15 DIAGNOSIS — E1169 Type 2 diabetes mellitus with other specified complication: Secondary | ICD-10-CM | POA: Diagnosis not present

## 2019-09-15 DIAGNOSIS — H8309 Labyrinthitis, unspecified ear: Secondary | ICD-10-CM | POA: Diagnosis not present

## 2019-09-15 DIAGNOSIS — Z23 Encounter for immunization: Secondary | ICD-10-CM | POA: Diagnosis not present

## 2019-09-15 DIAGNOSIS — M130 Polyarthritis, unspecified: Secondary | ICD-10-CM | POA: Diagnosis not present

## 2019-09-15 DIAGNOSIS — I10 Essential (primary) hypertension: Secondary | ICD-10-CM | POA: Diagnosis not present

## 2019-11-15 DIAGNOSIS — H8309 Labyrinthitis, unspecified ear: Secondary | ICD-10-CM | POA: Diagnosis not present

## 2019-11-15 DIAGNOSIS — E1169 Type 2 diabetes mellitus with other specified complication: Secondary | ICD-10-CM | POA: Diagnosis not present

## 2019-11-15 DIAGNOSIS — M130 Polyarthritis, unspecified: Secondary | ICD-10-CM | POA: Diagnosis not present

## 2019-11-15 DIAGNOSIS — I10 Essential (primary) hypertension: Secondary | ICD-10-CM | POA: Diagnosis not present

## 2019-11-22 ENCOUNTER — Ambulatory Visit: Payer: Medicare Other | Attending: Internal Medicine

## 2019-11-22 DIAGNOSIS — Z23 Encounter for immunization: Secondary | ICD-10-CM | POA: Insufficient documentation

## 2019-11-22 NOTE — Progress Notes (Signed)
   [id number]  Name:  Harold BEDSWORTH Sr.    MRN: [id number] DOB: 1951-01-11  11/22/2019  [name] was observed post Covid-19 immunization for 15 minutes without incidence. He was provided with Vaccine Information Sheet and instruction to access the V-Safe system.   [name] was instructed to call 911 with any severe reactions post vaccine: Marland Kitchen Difficulty breathing  . Swelling of your face and throat  . A fast heartbeat  . A bad rash all over your body  . Dizziness and weakness    Immunizations Administered    Name Date Dose VIS Date Route   Pfizer COVID-19 Vaccine 11/22/2019  1:16 PM 0.3 mL 09/24/2019 Intramuscular   Manufacturer: [place]   Lot: [id number]   [place]: [id number]

## 2019-12-06 ENCOUNTER — Encounter: Payer: Self-pay | Admitting: Gastroenterology

## 2019-12-17 ENCOUNTER — Ambulatory Visit: Payer: Medicare Other | Attending: Internal Medicine

## 2019-12-17 DIAGNOSIS — Z23 Encounter for immunization: Secondary | ICD-10-CM | POA: Insufficient documentation

## 2019-12-17 NOTE — Progress Notes (Signed)
   [id number] [facility]  Name:  Harold [name] Sr.    MRN: [id number] DOB: 03-Aug-1951  12/17/2019  Harold [name] was observed post Covid-19 immunization for 15 minutes without incident. He was provided with Vaccine Information Sheet and instruction to access the V-Safe system.   Harold [name] was instructed to call 911 with any severe reactions post vaccine: Marland Kitchen Difficulty breathing  . Swelling of face and throat  . A fast heartbeat  . A bad rash all over body  . Dizziness and weakness   Immunizations Administered    Name Date Dose VIS Date Route   Pfizer COVID-19 Vaccine 12/17/2019  8:11 AM 0.3 mL 09/24/2019 Intramuscular   Manufacturer: [place]   Lot: [id number]   [place]: [id number]

## 2019-12-29 ENCOUNTER — Other Ambulatory Visit: Payer: Self-pay

## 2019-12-29 ENCOUNTER — Ambulatory Visit (AMBULATORY_SURGERY_CENTER): Payer: Self-pay | Admitting: *Deleted

## 2019-12-29 VITALS — Temp 96.8°F | Ht 69.5 in | Wt 254.0 lb

## 2019-12-29 DIAGNOSIS — Z86010 Personal history of colonic polyps: Secondary | ICD-10-CM

## 2019-12-29 NOTE — Progress Notes (Signed)
Patient is here in-person for PV. Patient denies any allergies to eggs or soy. Patient denies any problems with anesthesia/sedation. Patient denies any oxygen use at home. Patient denies taking any diet/weight loss medications or blood thinners. Patient is not being treated for MRSA or C-diff. COVID-19 screening test is not needed, both vaccines completed on 12/17/2019 per pt. Patient is aware of our care-partner policy and 0000000 safety protocol.

## 2020-01-12 ENCOUNTER — Ambulatory Visit (AMBULATORY_SURGERY_CENTER): Payer: Medicare Other | Admitting: Gastroenterology

## 2020-01-12 ENCOUNTER — Other Ambulatory Visit: Payer: Self-pay

## 2020-01-12 ENCOUNTER — Encounter: Payer: Self-pay | Admitting: Gastroenterology

## 2020-01-12 VITALS — BP 150/88 | HR 56 | Temp 96.8°F | Resp 16 | Ht 69.5 in | Wt 254.0 lb

## 2020-01-12 DIAGNOSIS — D122 Benign neoplasm of ascending colon: Secondary | ICD-10-CM

## 2020-01-12 DIAGNOSIS — D120 Benign neoplasm of cecum: Secondary | ICD-10-CM

## 2020-01-12 DIAGNOSIS — D128 Benign neoplasm of rectum: Secondary | ICD-10-CM

## 2020-01-12 DIAGNOSIS — D127 Benign neoplasm of rectosigmoid junction: Secondary | ICD-10-CM

## 2020-01-12 DIAGNOSIS — D123 Benign neoplasm of transverse colon: Secondary | ICD-10-CM

## 2020-01-12 DIAGNOSIS — Z86010 Personal history of colonic polyps: Secondary | ICD-10-CM | POA: Diagnosis not present

## 2020-01-12 DIAGNOSIS — K621 Rectal polyp: Secondary | ICD-10-CM | POA: Diagnosis not present

## 2020-01-12 DIAGNOSIS — D124 Benign neoplasm of descending colon: Secondary | ICD-10-CM | POA: Diagnosis not present

## 2020-01-12 HISTORY — PX: COLONOSCOPY: SHX174

## 2020-01-12 MED ORDER — SODIUM CHLORIDE 0.9 % IV SOLN: 500.0000 mL | Freq: Once | INTRAVENOUS | Status: DC

## 2020-01-12 NOTE — Op Note (Addendum)
[facility] Patient Name: Harold [name] Procedure Date: 01/12/2020 9:11 AM MRN: [id number] Endoscopist: Gabriel Mansouraty , MD Age: 69 Referring MD:  Date of Birth: 10/16/1950 Gender: Male Account #: [id number] Procedure:                Colonoscopy Indications:              Surveillance: Personal history of adenomatous                            polyps on last colonoscopy > 5 years ago Medicines:                Monitored Anesthesia Care Procedure:                Pre-Anesthesia Assessment:                           - Prior to the procedure, a History and Physical                            was performed, and patient medications and                            allergies were reviewed. The patient's tolerance of                            previous anesthesia was also reviewed. The risks                            and benefits of the procedure and the sedation                            options and risks were discussed with the patient.                            All questions were answered, and informed consent                            was obtained. Prior Anticoagulants: The patient has                            taken no previous anticoagulant or antiplatelet                            agents. ASA Grade Assessment: II - A patient with                            mild systemic disease. After reviewing the risks                            and benefits, the patient was deemed in                            satisfactory condition to undergo the procedure.                             After obtaining informed consent, the colonoscope                            was passed under direct vision. Throughout the                            procedure, the patient's blood pressure, pulse, and                            oxygen saturations were monitored continuously. The                            Colonoscope was introduced through the anus and                            advanced to the the  cecum, identified by                            appendiceal orifice and ileocecal valve. The                            colonoscopy was performed without difficulty. The                            patient tolerated the procedure. The quality of the                            bowel preparation was good. The ileocecal valve,                            appendiceal orifice, and rectum were photographed. Scope In: 9:17:42 AM Scope Out: 9:41:35 AM Scope Withdrawal Time: 0 hours 20 minutes 50 seconds  Total Procedure Duration: 0 hours 23 minutes 53 seconds  Findings:                 The digital rectal exam findings include                            hemorrhoids. Pertinent negatives include no                            palpable rectal lesions.                           Seven sessile polyps were found in the descending                            colon, transverse colon, ascending colon and cecum.                            The polyps were 2 to 7 mm in size. These polyps                            were removed with a cold snare. Resection and                              retrieval were complete.                           Multiple (>20) sessile polyps were found in the                            rectum and recto-sigmoid colon. The polyps were 1                            to 8 mm in size. They have likely appearance of                            hyperplastic polyps. Six of these polyps were                            removed with a cold snare to rule out adenomatous                            tissue. Resection and retrieval were complete.                           Normal mucosa was found in the entire colon                            otherwise.                           Non-bleeding non-thrombosed external and internal                            hemorrhoids were found during retroflexion, during                            perianal exam and during digital exam. The                             hemorrhoids were Grade II (internal hemorrhoids                            that prolapse but reduce spontaneously). Complications:            No immediate complications. Estimated Blood Loss:     Estimated blood loss was minimal. Impression:               - Hemorrhoids found on digital rectal exam.                           - Seven 2 to 7 mm polyps in the descending colon,                            in the transverse colon, in the ascending colon and                            in the cecum, removed with a cold snare. Resected  and retrieved.                           - Multiple 1 to 8 mm polyps in the rectum and at                            the recto-sigmoid colon, six removed with a cold                            snare. Resected and retrieved.                           - Normal mucosa in the entire examined colon                            otherwise.                           - Non-bleeding non-thrombosed external and internal                            hemorrhoids. Recommendation:           - The patient will be observed post-procedure,                            until all discharge criteria are met.                           - Discharge patient to home.                           - Patient has a contact number available for                            emergencies. The signs and symptoms of potential                            delayed complications were discussed with the                            patient. Return to normal activities tomorrow.                            Written discharge instructions were provided to the                            patient.                           - High fiber diet.                           - Use FiberCon 1 tablet PO daily.                           - Continue present medications.                           -  Await pathology results.                           - Repeat colonoscopy based on final pathology. If                             patient has any adenomatous polyps in the R/S &                            Rectal jar then would recommend a flexible                            sigmoidoscopy within next 62-month to remove the                            remaining polyps. If no adenomatous tissue in R/S &                            Rectal Jar then most likely repeat colonoscopy in 3                            years for surveillance..                           - The findings and recommendations were discussed                            with the patient.                           - The findings and recommendations were discussed                            with the patient's family. GJustice Britain MD 01/12/2020 9:49:25 AM

## 2020-01-12 NOTE — Progress Notes (Signed)
Report given to PACU, vss 

## 2020-01-12 NOTE — Progress Notes (Signed)
Called to room to assist during endoscopic procedure.  Patient ID and intended procedure confirmed with present staff. Received instructions for my participation in the procedure from the performing physician.  

## 2020-01-12 NOTE — Patient Instructions (Signed)
Handouts given:  Polyps X 13, Hemorrhoids, High fiber diet Start High fiber diet Use Fiber cone 1 tablet daily by mouth Continue current medications Await pathology results    YOU HAD AN ENDOSCOPIC PROCEDURE TODAY AT [place]:   Refer to the procedure report that was given to you for any specific questions about what was found during the examination.  If the procedure report does not answer your questions, please call your gastroenterologist to clarify.  If you requested that your care partner not be given the details of your procedure findings, then the procedure report has been included in a sealed envelope for you to review at your convenience later.  YOU SHOULD EXPECT: Some feelings of bloating in the abdomen. Passage of more gas than usual.  Walking can help get rid of the air that was put into your GI tract during the procedure and reduce the bloating. If you had a lower endoscopy (such as a colonoscopy or flexible sigmoidoscopy) you may notice spotting of blood in your stool or on the toilet paper. If you underwent a bowel prep for your procedure, you may not have a normal bowel movement for a few days.  Please Note:  You might notice some irritation and congestion in your nose or some drainage.  This is from the oxygen used during your procedure.  There is no need for concern and it should clear up in a day or so.  SYMPTOMS TO REPORT IMMEDIATELY:   Following lower endoscopy (colonoscopy or flexible sigmoidoscopy):  Excessive amounts of blood in the stool  Significant tenderness or worsening of abdominal pains  Swelling of the abdomen that is new, acute  Fever of 100F or higher   For urgent or emergent issues, a gastroenterologist can be reached at any hour by calling [phone]. Do not use MyChart messaging for urgent concerns.    DIET:  We do recommend a small meal at first, but then you may proceed to your regular diet.  Drink plenty of fluids but you  should avoid alcoholic beverages for 24 hours.  ACTIVITY:  You should plan to take it easy for the rest of today and you should NOT DRIVE or use heavy machinery until tomorrow (because of the sedation medicines used during the test).    FOLLOW UP: Our staff will call the number listed on your records 48-72 hours following your procedure to check on you and address any questions or concerns that you may have regarding the information given to you following your procedure. If we do not reach you, we will leave a message.  We will attempt to reach you two times.  During this call, we will ask if you have developed any symptoms of COVID 19. If you develop any symptoms (ie: fever, flu-like symptoms, shortness of breath, cough etc.) before then, please call [phone].  If you test positive for Covid 19 in the 2 weeks post procedure, please call and report this information to [place].    If any biopsies were taken you will be contacted by phone or by letter within the next 1-3 weeks.  Please call us at [phone] if you have not heard about the biopsies in 3 weeks.    SIGNATURES/CONFIDENTIALITY: You and/or your care partner have signed paperwork which will be entered into your electronic medical record.  These signatures attest to the fact that that the information above on your After Visit Summary has been reviewed and is understood.  Full responsibility  of the confidentiality of this discharge information lies with you and/or your care-partner.

## 2020-01-12 NOTE — Progress Notes (Signed)
Pt's states no medical or surgical changes since previsit or office visit.  Vitals CW Temp LC

## 2020-01-17 ENCOUNTER — Telehealth: Payer: Self-pay

## 2020-01-17 ENCOUNTER — Telehealth: Payer: Self-pay | Admitting: *Deleted

## 2020-01-17 NOTE — Telephone Encounter (Signed)
  Follow up Call-  Call back number 01/12/2020  Post procedure Call Back phone  # [phone]  Permission to leave phone message Yes  Some recent data might be hidden     Patient questions:  Do you have a fever, pain , or abdominal swelling? No. Pain Score  0 *  Have you tolerated food without any problems? Yes.    Have you been able to return to your normal activities? Yes.    Do you have any questions about your discharge instructions: Diet   No. Medications  No. Follow up visit  No.  Do you have questions or concerns about your Care? No.  Actions: * If pain score is 4 or above: No action needed, pain <4.  1. Have you developed a fever since your procedure? no  2.   Have you had an respiratory symptoms (SOB or cough) since your procedure? no  3.   Have you tested positive for COVID 19 since your procedure no  4.   Have you had any family members/close contacts diagnosed with the COVID 19 since your procedure?  no   If yes to any of these questions please route to Joylene John, RN and Erenest Rasher, RN

## 2020-01-17 NOTE — Telephone Encounter (Signed)
Attempted f/u phone call. No answer. No option to leave message.  

## 2020-01-20 ENCOUNTER — Encounter: Payer: Self-pay | Admitting: Gastroenterology

## 2020-03-13 DIAGNOSIS — I10 Essential (primary) hypertension: Secondary | ICD-10-CM | POA: Diagnosis not present

## 2020-03-13 DIAGNOSIS — E1169 Type 2 diabetes mellitus with other specified complication: Secondary | ICD-10-CM | POA: Diagnosis not present

## 2020-03-14 DIAGNOSIS — M130 Polyarthritis, unspecified: Secondary | ICD-10-CM | POA: Diagnosis not present

## 2020-03-14 DIAGNOSIS — E1169 Type 2 diabetes mellitus with other specified complication: Secondary | ICD-10-CM | POA: Diagnosis not present

## 2020-03-14 DIAGNOSIS — I10 Essential (primary) hypertension: Secondary | ICD-10-CM | POA: Diagnosis not present

## 2020-03-23 DIAGNOSIS — I10 Essential (primary) hypertension: Secondary | ICD-10-CM | POA: Diagnosis not present

## 2020-03-23 DIAGNOSIS — H8309 Labyrinthitis, unspecified ear: Secondary | ICD-10-CM | POA: Diagnosis not present

## 2020-04-04 ENCOUNTER — Other Ambulatory Visit: Payer: Self-pay | Admitting: Urology

## 2020-04-04 DIAGNOSIS — R972 Elevated prostate specific antigen [PSA]: Secondary | ICD-10-CM

## 2020-04-12 DIAGNOSIS — I270 Primary pulmonary hypertension: Secondary | ICD-10-CM | POA: Diagnosis not present

## 2020-04-12 DIAGNOSIS — E1165 Type 2 diabetes mellitus with hyperglycemia: Secondary | ICD-10-CM | POA: Diagnosis not present

## 2020-04-12 DIAGNOSIS — Z794 Long term (current) use of insulin: Secondary | ICD-10-CM | POA: Diagnosis not present

## 2020-05-04 ENCOUNTER — Ambulatory Visit
Admission: RE | Admit: 2020-05-04 | Discharge: 2020-05-04 | Disposition: A | Discharge status: Home / Self Care | Payer: Medicare Other | Source: Ambulatory Visit | Attending: Urology | Admitting: Urology

## 2020-05-04 ENCOUNTER — Other Ambulatory Visit: Payer: Self-pay

## 2020-05-04 DIAGNOSIS — R972 Elevated prostate specific antigen [PSA]: Secondary | ICD-10-CM

## 2020-05-04 IMAGING — MR MR PROSTATE WO/W CM
56 series · 56 of 56 positions shown · IV contrast (20ml Multihance)
Comparison: [DATE]

CLINICAL DATA: Elevated PSA level. Biopsy on [DATE] did not
reveal cancer.

EXAM:
MR PROSTATE WITHOUT AND WITH CONTRAST
TECHNIQUE: Multiplanar multisequence MRI images were obtained of the pelvis
centered about the prostate. Pre and post contrast images were
obtained.
CONTRAST:  20mL MULTIHANCE GADOBENATE DIMEGLUMINE 529 MG/ML IV SOLN

[Series 3: bSSFP fat-sat · axial · 8.0mm · 0.78mm/px · 1 of 28 slices shown]
[im 1/28]
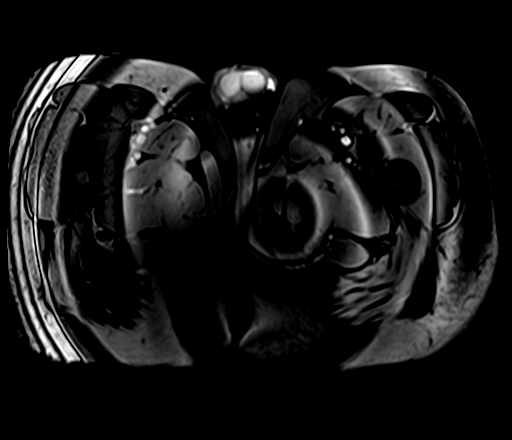

[Series 4: T1 · axial · 5.0mm · 1.25mm/px · 1 of 112 slices shown]
[im 1/112]
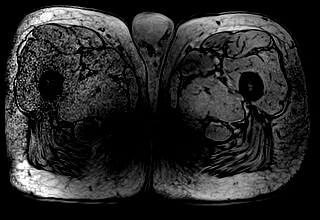

[Series 5: T2 · coronal · 3.5mm · 0.70mm/px · 1 of 25 slices shown (1 of 3)]
[im 1/25]
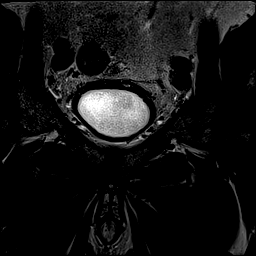

[Series 6: DWI · axial · 3.5mm · 1.75mm/px · 1 of 66 slices shown (1 of 3)]
[im 1/66]
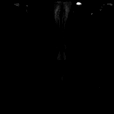

[Series 7: DWI · axial · 3.5mm · 1.75mm/px · 1 of 22 slices shown (2 of 3)]
[im 1/22]
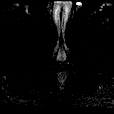

[Series 8: DWI · axial · 3.5mm · 1.56mm/px · 1 of 22 slices shown (3 of 3)]
[im 1/22]
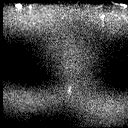

[Series 9: T2 · axial · 3.5mm · 0.70mm/px · 1 of 25 slices shown (2 of 3)]
[im 1/25]
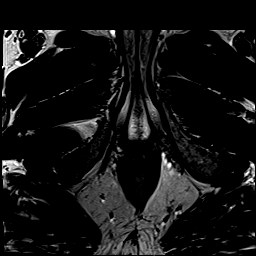

[Series 10: T2 · axial · 1.0mm · 1.04mm/px · 1 of 80 slices shown (3 of 3)]
[im 1/80]
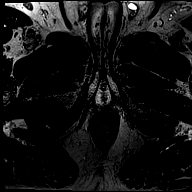

[Series 11: pre t1_twist_tra_dyn_ttc=5.3s · axial · non-contrast · 3.5mm · 0.83mm/px · 1 of 20 slices shown]
[im 1/20]
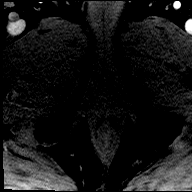

[Series 12: post t1_twist_tra_dyn-copy center · axial · 3.5mm · 0.83mm/px · 1 of 20 slices shown (1 of 24)]
[im 1/20]
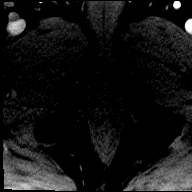

[Series 13: post t1_twist_tra_dyn-copy center · axial · 3.5mm · 0.83mm/px · 1 of 20 slices shown (2 of 24)]
[im 1/20]
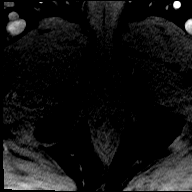

[Series 14: post t1_twist_tra_dyn-copy cent_sub_ttc=(id) · axial · 3.5mm · 0.83mm/px · 1 of 18 slices shown (1 of 23)]
[im 1/18]
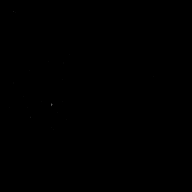

[Series 15: post t1_twist_tra_dyn-copy center · axial · 3.5mm · 0.83mm/px · 1 of 20 slices shown (3 of 24)]
[im 1/20]
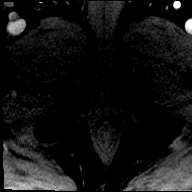

[Series 16: post t1_twist_tra_dyn-copy cent_sub_ttc=(id) · axial · 3.5mm · 0.83mm/px · 1 of 20 slices shown (2 of 23)]
[im 1/20]
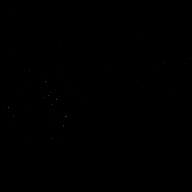

[Series 17: post t1_twist_tra_dyn-copy center · axial · 3.5mm · 0.83mm/px · 1 of 20 slices shown (4 of 24)]
[im 1/20]
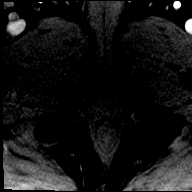

[Series 18: post t1_twist_tra_dyn-copy cent_sub_ttc=(id) · axial · 3.5mm · 0.83mm/px · 1 of 11 slices shown (3 of 23)]
[im 1/11]
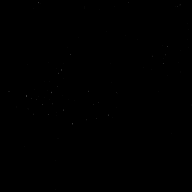

[Series 19: post t1_twist_tra_dyn-copy center · axial · 3.5mm · 0.83mm/px · 1 of 20 slices shown (5 of 24)]
[im 1/20]
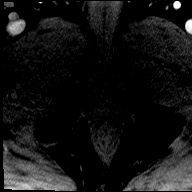

[Series 20: post t1_twist_tra_dyn-copy cent_sub_ttc=(id) · axial · 3.5mm · 0.83mm/px · 1 of 16 slices shown (4 of 23)]
[im 1/16]
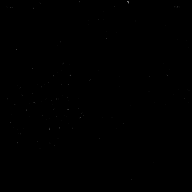

[Series 21: post t1_twist_tra_dyn-copy center · axial · 3.5mm · 0.83mm/px · 1 of 20 slices shown (6 of 24)]
[im 1/20]
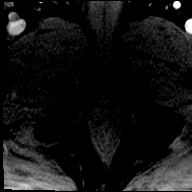

[Series 22: post t1_twist_tra_dyn-copy cent_sub_ttc=(id) · axial · 3.5mm · 0.83mm/px · 1 of 19 slices shown (5 of 23)]
[im 1/19]
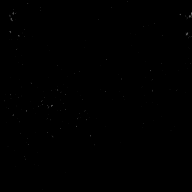

[Series 23: post t1_twist_tra_dyn-copy center · axial · 3.5mm · 0.83mm/px · 1 of 20 slices shown (7 of 24)]
[im 1/20]
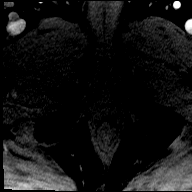

[Series 24: post t1_twist_tra_dyn-copy cent_sub_ttc=(id) · axial · 3.5mm · 0.83mm/px · 1 of 15 slices shown (6 of 23)]
[im 1/15]
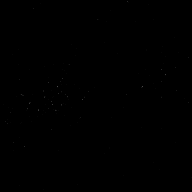

[Series 25: post t1_twist_tra_dyn-copy center · axial · 3.5mm · 0.83mm/px · 1 of 20 slices shown (8 of 24)]
[im 1/20]
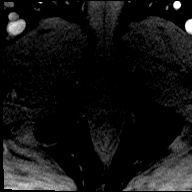

[Series 26: post t1_twist_tra_dyn-copy cent_sub_ttc=(id) · axial · 3.5mm · 0.83mm/px · 1 of 19 slices shown (7 of 23)]
[im 1/19]
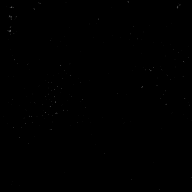

[Series 27: post t1_twist_tra_dyn-copy center · axial · 3.5mm · 0.83mm/px · 1 of 20 slices shown (9 of 24)]
[im 1/20]
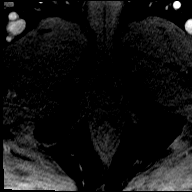

[Series 28: post t1_twist_tra_dyn-copy cent_sub_ttc=(id) · axial · 3.5mm · 0.83mm/px · 1 of 20 slices shown (8 of 23)]
[im 1/20]
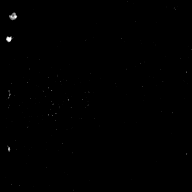

[Series 29: post t1_twist_tra_dyn-copy center · axial · 3.5mm · 0.83mm/px · 1 of 20 slices shown (10 of 24)]
[im 1/20]
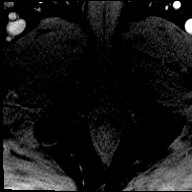

[Series 30: post t1_twist_tra_dyn-copy cent_sub_ttc=(id) · axial · 3.5mm · 0.83mm/px · 1 of 20 slices shown (9 of 23)]
[im 1/20]
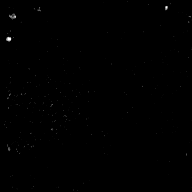

[Series 31: post t1_twist_tra_dyn-copy center · axial · 3.5mm · 0.83mm/px · 1 of 20 slices shown (11 of 24)]
[im 1/20]
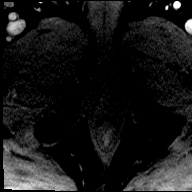

[Series 32: post t1_twist_tra_dyn-copy cent_sub_ttc=(id) · axial · 3.5mm · 0.83mm/px · 1 of 20 slices shown (10 of 23)]
[im 1/20]
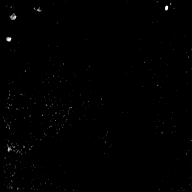

[Series 33: post t1_twist_tra_dyn-copy center · axial · 3.5mm · 0.83mm/px · 1 of 20 slices shown (12 of 24)]
[im 1/20]
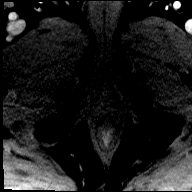

[Series 34: post t1_twist_tra_dyn-copy cent_sub_ttc=(id) · axial · 3.5mm · 0.83mm/px · 1 of 20 slices shown (11 of 23)]
[im 1/20]
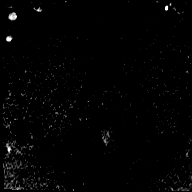

[Series 35: post t1_twist_tra_dyn-copy center · axial · 3.5mm · 0.83mm/px · 1 of 20 slices shown (13 of 24)]
[im 1/20]
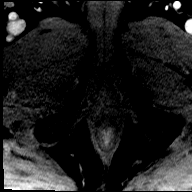

[Series 36: post t1_twist_tra_dyn-copy cent_sub_ttc=(id) · axial · 3.5mm · 0.83mm/px · 1 of 20 slices shown (12 of 23)]
[im 1/20]
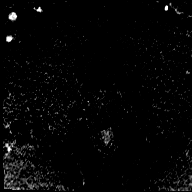

[Series 37: post t1_twist_tra_dyn-copy center · axial · 3.5mm · 0.83mm/px · 1 of 20 slices shown (14 of 24)]
[im 1/20]
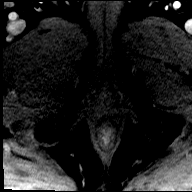

[Series 38: post t1_twist_tra_dyn-copy cent_sub_ttc=(id) · axial · 3.5mm · 0.83mm/px · 1 of 20 slices shown (13 of 23)]
[im 1/20]
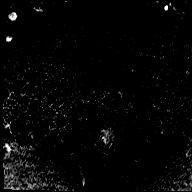

[Series 39: post t1_twist_tra_dyn-copy center · axial · 3.5mm · 0.83mm/px · 1 of 20 slices shown (15 of 24)]
[im 1/20]
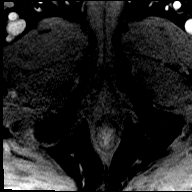

[Series 40: post t1_twist_tra_dyn-copy cent_sub_ttc=(id) · axial · 3.5mm · 0.83mm/px · 1 of 20 slices shown (14 of 23)]
[im 1/20]
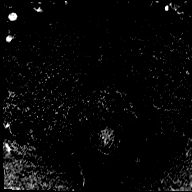

[Series 41: post t1_twist_tra_dyn-copy center · axial · 3.5mm · 0.83mm/px · 1 of 20 slices shown (16 of 24)]
[im 1/20]
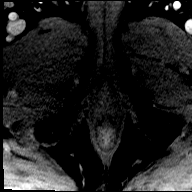

[Series 42: post t1_twist_tra_dyn-copy cent_sub_ttc=(id) · axial · 3.5mm · 0.83mm/px · 1 of 20 slices shown (15 of 23)]
[im 1/20]
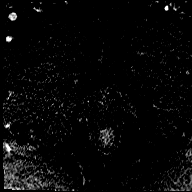

[Series 43: post t1_twist_tra_dyn-copy center · axial · 3.5mm · 0.83mm/px · 1 of 20 slices shown (17 of 24)]
[im 1/20]
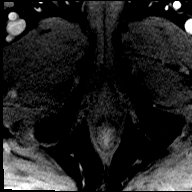

[Series 44: post t1_twist_tra_dyn-copy cent_sub_ttc=(id) · axial · 3.5mm · 0.83mm/px · 1 of 20 slices shown (16 of 23)]
[im 1/20]
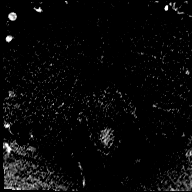

[Series 45: post t1_twist_tra_dyn-copy center · axial · 3.5mm · 0.83mm/px · 1 of 20 slices shown (18 of 24)]
[im 1/20]
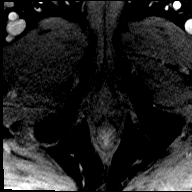

[Series 46: post t1_twist_tra_dyn-copy cent_sub_ttc=(id) · axial · 3.5mm · 0.83mm/px · 1 of 20 slices shown (17 of 23)]
[im 1/20]
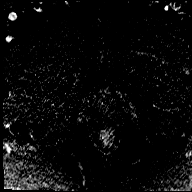

[Series 47: post t1_twist_tra_dyn-copy center · axial · 3.5mm · 0.83mm/px · 1 of 20 slices shown (19 of 24)]
[im 1/20]
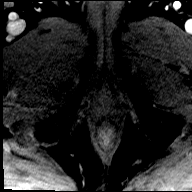

[Series 48: post t1_twist_tra_dyn-copy cent_sub_ttc=(id) · axial · 3.5mm · 0.83mm/px · 1 of 20 slices shown (18 of 23)]
[im 1/20]
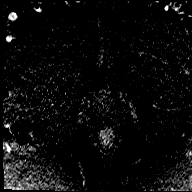

[Series 49: post t1_twist_tra_dyn-copy center · axial · 3.5mm · 0.83mm/px · 1 of 20 slices shown (20 of 24)]
[im 1/20]
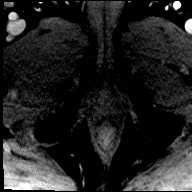

[Series 50: post t1_twist_tra_dyn-copy cent_sub_ttc=(id) · axial · 3.5mm · 0.83mm/px · 1 of 20 slices shown (19 of 23)]
[im 1/20]
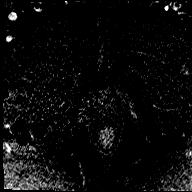

[Series 51: post t1_twist_tra_dyn-copy center · axial · 3.5mm · 0.83mm/px · 1 of 20 slices shown (21 of 24)]
[im 1/20]
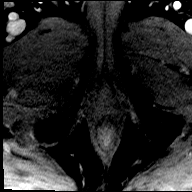

[Series 52: post t1_twist_tra_dyn-copy cent_sub_ttc=(id) · axial · 3.5mm · 0.83mm/px · 1 of 20 slices shown (20 of 23)]
[im 1/20]
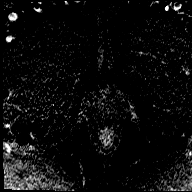

[Series 53: post t1_twist_tra_dyn-copy center · axial · 3.5mm · 0.83mm/px · 1 of 20 slices shown (22 of 24)]
[im 1/20]
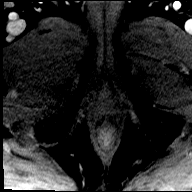

[Series 54: post t1_twist_tra_dyn-copy cent_sub_ttc=(id) · axial · 3.5mm · 0.83mm/px · 1 of 20 slices shown (21 of 23)]
[im 1/20]
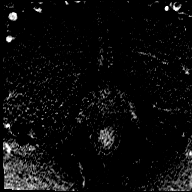

[Series 55: post t1_twist_tra_dyn-copy center · axial · 3.5mm · 0.83mm/px · 1 of 20 slices shown (23 of 24)]
[im 1/20]
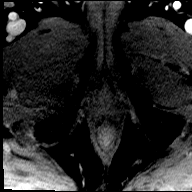

[Series 56: post t1_twist_tra_dyn-copy cent_sub_ttc=(id) · axial · 3.5mm · 0.83mm/px · 1 of 20 slices shown (22 of 23)]
[im 1/20]
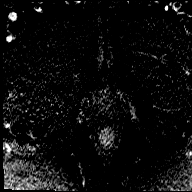

[Series 57: post t1_twist_tra_dyn-copy center · axial · 3.5mm · 0.83mm/px · 1 of 20 slices shown (24 of 24)]
[im 1/20]
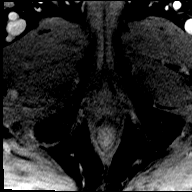

[Series 58: post t1_twist_tra_dyn-copy cent_sub_ttc=(id) · axial · 3.5mm · 0.83mm/px · 1 of 20 slices shown (23 of 23)]
[im 1/20]
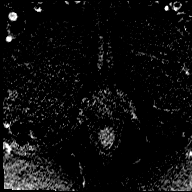

[56 of 56 positions shown; findings below may reference images not displayed]

FINDINGS: Prostate: Progressive diffuse peripheral zone enhancement, likely
postinflammatory.

Region of interest # 1: PI-RADS category 4 lesion of the left
posterolateral and posteromedial peripheral zone in the mid gland.
This has low T2 signal characteristics and mild focal early
enhancement which is mildly disproportionate to the degree of
diffuse peripheral zone enhancement. This lesion measures 0.63 cubic
cm (2.2 by 0.5 by 0.8 cm) and is shown for example on image 46/10.

Region of interest # 2: PI-RADS category 3 lesion of the right
posterolateral and posteromedial peripheral zone in the apex, with
low T2 signal, but with early enhancement not disproportionate to
that of the surrounding generalized enhancement in the gland. This
lesion measures 0.48 cubic cm (1.7 by 0.7 by 0.7 cm) and is shown
for example on image 53/10.

Region of interest # 3: PI-RADS category 3 lesion of the right
anterior and posterolateral peripheral zone involving the mid gland
and apex with low T2 signal but no disproportionate enhancement to
the rest of the peripheral zone. This measures 1.03 cubic cm (1.6 by
0.7 by 1.7 cm) and is shown for example anteriorly on image 53/10.

Other tiny foci of low T2 signal in the peripheral zone observed
along with mild stranding which is likely postinflammatory.

Encapsulated nodularity in the central gland without accentuated
restriction of diffusion, favoring benign prostatic hypertrophy.

Volume: 3D volumetric analysis: Prostate volume 79.68 cubic cm (5.7
by 5.0 by 5.8 cm).

Transcapsular spread:  Absent

Seminal vesicle involvement: Absent

Neurovascular bundle involvement: Absent

Pelvic adenopathy: Absent

Bone metastasis: Absent

Other findings: Degenerative findings in the left hip.
IMPRESSION: 1. PI-RADS category 4 lesion in the left posterolateral and
posteromedial peripheral zone in the mid gland.
2. Two PI-RADS category 3 lesions in the right peripheral zone.
These lesions have reduced T2 signal, but do not have early
enhancement disproportionate to the rest of the generalized
enhancement in the peripheral zone.
3. Benign prostatic hypertrophy.  Prostate volume 79.68 cubic cm.
4. Targeting data sent to [facility].

## 2020-05-04 MED ORDER — GADOBENATE DIMEGLUMINE 529 MG/ML IV SOLN
20.0000 mL | Freq: Once | INTRAVENOUS | Status: AC | PRN
  Administered 2020-05-04: 15:00:00 20 mL via INTRAVENOUS

## 2020-05-23 DIAGNOSIS — N411 Chronic prostatitis: Secondary | ICD-10-CM | POA: Diagnosis not present

## 2020-07-14 DIAGNOSIS — M130 Polyarthritis, unspecified: Secondary | ICD-10-CM | POA: Diagnosis not present

## 2020-07-14 DIAGNOSIS — I10 Essential (primary) hypertension: Secondary | ICD-10-CM | POA: Diagnosis not present

## 2020-07-14 DIAGNOSIS — E7800 Pure hypercholesterolemia, unspecified: Secondary | ICD-10-CM | POA: Diagnosis not present

## 2020-07-14 DIAGNOSIS — Z23 Encounter for immunization: Secondary | ICD-10-CM | POA: Diagnosis not present

## 2020-07-14 DIAGNOSIS — E1169 Type 2 diabetes mellitus with other specified complication: Secondary | ICD-10-CM | POA: Diagnosis not present

## 2020-07-31 DIAGNOSIS — K439 Ventral hernia without obstruction or gangrene: Secondary | ICD-10-CM | POA: Diagnosis not present

## 2020-07-31 DIAGNOSIS — H8309 Labyrinthitis, unspecified ear: Secondary | ICD-10-CM | POA: Diagnosis not present

## 2020-07-31 DIAGNOSIS — K5221 Food protein-induced enterocolitis syndrome: Secondary | ICD-10-CM | POA: Diagnosis not present

## 2020-07-31 DIAGNOSIS — F5102 Adjustment insomnia: Secondary | ICD-10-CM | POA: Diagnosis not present

## 2020-08-12 DIAGNOSIS — E1169 Type 2 diabetes mellitus with other specified complication: Secondary | ICD-10-CM | POA: Diagnosis not present

## 2020-08-12 DIAGNOSIS — I10 Essential (primary) hypertension: Secondary | ICD-10-CM | POA: Diagnosis not present

## 2020-08-12 DIAGNOSIS — M130 Polyarthritis, unspecified: Secondary | ICD-10-CM | POA: Diagnosis not present

## 2020-08-24 DIAGNOSIS — H9313 Tinnitus, bilateral: Secondary | ICD-10-CM | POA: Diagnosis not present

## 2020-08-24 DIAGNOSIS — H8113 Benign paroxysmal vertigo, bilateral: Secondary | ICD-10-CM | POA: Diagnosis not present

## 2020-08-24 DIAGNOSIS — J343 Hypertrophy of nasal turbinates: Secondary | ICD-10-CM | POA: Diagnosis not present

## 2020-09-04 ENCOUNTER — Other Ambulatory Visit: Payer: Self-pay

## 2020-09-04 ENCOUNTER — Encounter: Payer: Self-pay | Admitting: Physical Therapy

## 2020-09-04 ENCOUNTER — Ambulatory Visit: Payer: Medicare Other | Attending: Otolaryngology | Admitting: Physical Therapy

## 2020-09-04 DIAGNOSIS — R2681 Unsteadiness on feet: Secondary | ICD-10-CM | POA: Insufficient documentation

## 2020-09-04 DIAGNOSIS — R42 Dizziness and giddiness: Secondary | ICD-10-CM | POA: Diagnosis not present

## 2020-09-04 DIAGNOSIS — H8111 Benign paroxysmal vertigo, right ear: Secondary | ICD-10-CM | POA: Diagnosis not present

## 2020-09-04 DIAGNOSIS — H8112 Benign paroxysmal vertigo, left ear: Secondary | ICD-10-CM | POA: Insufficient documentation

## 2020-09-04 NOTE — Therapy (Signed)
[facility] [address] [place], [place], [address] Phone: [phone]   Fax:  [phone]  Physical Therapy Evaluation  Patient Details  Name: Harold TAMARGO Sr. MRN: [id number] Date of Birth: Feb 19, 1951 Referring Provider (PT): Dr. Redmond Baseman, MD   Encounter Date: 09/04/2020   PT End of Session - 09/04/20 1427    Visit Number 1    Number of Visits 9    Date for PT Re-Evaluation 10/04/20    Authorization Type [employer]    Authorization - Visit Number 1    PT Start Time 6389    PT Stop Time 1100    PT Time Calculation (min) 45 min    Activity Tolerance Patient tolerated treatment well    Behavior During Therapy [facility] for tasks assessed/performed           Past Medical History:  Diagnosis Date  . Back pain   . Cataract   . Diabetes mellitus without complication ([place])   . Hypertension     Past Surgical History:  Procedure Laterality Date  . COLONOSCOPY  09/13/2014  . COLONOSCOPY  01/12/2020  . EYE SURGERY    . KNEE SURGERY     left knee  . MINOR CARPAL TUNNEL    . POLYPECTOMY    . TONSILLECTOMY      There were no vitals filed for this visit.    Subjective Assessment - 09/04/20 1024    Subjective Pt has been having dizziness for the past 6-8 months, but has gotten worse within the past month or so. Pt reports dizziness with standing up, stretching, yawning and bending over. Pt reports the room is spinning and he feels woozy. To resolve symptoms, pt sits still  with eyes closed. Symptoms subside within 1 min but nausea can linger up to 30 min. Pt wants to get back to being able to play golf and travel without dizziness.    Pertinent History back pain, DM, HTN, eye surgery, knee surgery, minor carpal tunnel    Patient Stated Goals Get rid of dizziness, be able to play golf    Currently in Pain? No/denies              [facility] PT Assessment - 09/04/20 1029      Assessment   Medical Diagnosis  Dizziness/BPPV    Referring Provider (PT) Dr. Redmond Baseman, MD    Onset Date/Surgical Date 02/12/20    Hand Dominance Right    Prior Therapy not for vestibular      Precautions   Precautions None    Precaution Comments back pain, DM, HTN, eye surgery, knee surgery, minor carpal tunnel      Balance Screen   Has the patient fallen in the past 6 months No   1 LOB and almost fall   Has the patient had a decrease in activity level because of a fear of falling?  Yes    Is the patient reluctant to leave their home because of a fear of falling?  No      Home Environment   Living Environment Private residence    Living Arrangements Alone   wife passed in June   Available Help at Discharge Family   kids and grandkids   Type of [place] to enter    Entrance Stairs-Number of Steps 2    Entrance Stairs-Rails Right;Left      Prior Function   Level of [place]  Vocation Retired    Leisure Production assistant, radio Status Within [employer] for tasks assessed      Observation/Other Assessments   Observations Tinnitus, no visual changes    Focus on Therapeutic Outcomes (FOTO)  60%    Other Surveys  Dizziness Handicap Inventory (DHI)    Dizziness Handicap Inventory [facility])  32                  Vestibular Assessment - 09/04/20 1040      Vestibular Assessment   General Observation Dizziness with bending forward, standing up quickly, yawning/stretching, hearing loss, tinnitus, no visual changes      Symptom Behavior   Type of Dizziness  Spinning    Frequency of Dizziness daily    Duration of Dizziness 1-2 minutes    Symptom Nature Motion provoked;Positional    Aggravating Factors Forward bending;Sit to stand;Comment    Relieving Factors Rest;Closing eyes;Head stationary    Progression of Symptoms Worse    History of similar episodes 6-8 months      Oculomotor Exam   Oculomotor Alignment Normal    Spontaneous  Absent    Gaze-induced  Absent    Smooth Pursuits Intact    Saccades Intact      Oculomotor Exam-Fixation Suppressed    Left Head Impulse Positive    Right Head Impulse Positive      Vestibulo-Ocular Reflex   VOR to Slow Head Movement Normal    VOR Cancellation Normal      Positional Testing   Dix-Hallpike Dix-Hallpike Right;Dix-Hallpike Left    Horizontal Canal Testing Horizontal Canal Right;Horizontal Canal Left      Dix-Hallpike Right   Dix-Hallpike Right Duration 0    Dix-Hallpike Right Symptoms No nystagmus      Dix-Hallpike Left   Dix-Hallpike Left Duration 10 seconds    Dix-Hallpike Left Symptoms Downbeat, left rotatory nystagmus      Horizontal Canal Right   Horizontal Canal Right Symptoms Normal      Horizontal Canal Left   Horizontal Canal Left Symptoms Normal              Objective measurements completed on examination: See above findings.        Vestibular Treatment/Exercise - 09/04/20 1101      Vestibular Treatment/Exercise   Vestibular Treatment Provided Canalith Repositioning    Canalith Repositioning Semont Procedure Left Anterior      Semont Procedure Left Anterior   Number of Reps  1    Overall Response  No change    Response Details  Pt reporting wooziness with going back up to R sidelying from looking down and to the left.                  PT Education - 09/04/20 1426    Education Details Pt educated on clinical findings upon evaluation.    Person(s) Educated Patient    Methods Explanation    Comprehension Verbalized understanding               PT Long Term Goals - 09/04/20 1443      PT LONG TERM GOAL #1   Title Pt will demo independence with progressive HEP.    Baseline NA    Time 4    Period Weeks    Status New    Target Date 10/04/20      PT LONG TERM GOAL #2   Title Pt will increase FOTO score to >/=  71% and will decrease DHI by 12 points    Baseline 60%, DHI: 32    Time 4    Period Weeks    Status New     Target Date 10/04/20      PT LONG TERM GOAL #3   Title Pt will be able to perform self Semont manuever at home in case of dizziness spell.    Baseline NA    Time 4    Period Weeks    Status New    Target Date 10/04/20      PT LONG TERM GOAL #4   Title Pt will demonstrate negative positional testing for all canals    Baseline L anterior canal canalithiasis    Time 4    Period Weeks    Status New    Target Date 10/04/20                  Plan - 09/04/20 1429    Clinical Impression Statement Pt has been having dizziness for the past 6-8 months, but has gotten worse within the past month or so. Pt reports dizziness with standing up, stretching, yawning and bending over. Pt reports the room is spinning and he feels woozy. To resolve symptoms, pt sits still  with eyes closed. Symptoms subside within 1 min but nausea can linger up to 30 min. Pt wants to get back to being able to play golf and travel without dizziness. Upon evaluation, pt presented with positive L and R HIT as well as down-beating and L rotary nystagmus with L DHP. PT performed L Semont maneuever with pt reporting wooziness going into R sidelying from initial positiong (L sidelying looking down at the floor). Pt with diagnosis of L anterior canal canalithiasis upon evaluation and bilat hypofunction placing patient at increased risk for falls due to dizziness and imbalance. PT to continue reasess via [place] testing and treating with Semont maneuver.    Personal Factors and Comorbidities Age;Time since onset of injury/illness/exacerbation;Social Background    Examination-Activity Limitations Bend;Stand;Bed Mobility    Examination-Participation Restrictions Other;Yard Work;Driving;Cleaning;Community Activity    Stability/Clinical Decision Making Evolving/Moderate complexity    Clinical Decision Making Moderate    Rehab Potential Good    PT Frequency 2x / week    PT Duration 4 weeks    PT Treatment/Interventions Canalith  Repostioning;Balance training;Therapeutic exercise;Neuromuscular re-education;Therapeutic activities;Functional mobility training;Stair training;Gait training;Patient/family education;Vestibular;ADLs/Self Care Home Management;Passive range of motion    PT Next Visit Plan Reassess L DHP, treat with L Semont  or prone treatment for anterior canal    Consulted and Agree with Plan of Care Patient           Patient will benefit from skilled therapeutic intervention in order to improve the following deficits and impairments:  Dizziness, Decreased range of motion, Decreased balance  Visit Diagnosis: Dizziness and giddiness  BPPV (benign paroxysmal positional vertigo), left  BPPV (benign paroxysmal positional vertigo), right  Unsteadiness on feet     Problem List Patient Active Problem List   Diagnosis Date Noted  . Diabetes mellitus, type II, insulin dependent ([place]) 07/14/2018  . Diabetic hyperosmolar non-ketotic state ([place]) 07/22/2017  . Hypertension 07/20/2017  . Infected blister 05/19/2017  . Sensorineural hearing loss (SNHL), bilateral 11/07/2016  . Tinnitus, bilateral 11/07/2016    Rosalita Levan, SPT 09/04/2020, 2:46 PM  [facility] [address] [place], [place], [address] Phone: [phone]   Fax:  [phone]  Name: Harold PIO Sr. MRN:  [id number] Date of Birth: 09/05/1951

## 2020-09-05 ENCOUNTER — Ambulatory Visit: Payer: Medicare Other | Admitting: Physical Therapy

## 2020-09-05 ENCOUNTER — Encounter: Payer: Self-pay | Admitting: Physical Therapy

## 2020-09-05 DIAGNOSIS — R2681 Unsteadiness on feet: Secondary | ICD-10-CM

## 2020-09-05 DIAGNOSIS — H8112 Benign paroxysmal vertigo, left ear: Secondary | ICD-10-CM

## 2020-09-05 DIAGNOSIS — R42 Dizziness and giddiness: Secondary | ICD-10-CM

## 2020-09-05 DIAGNOSIS — H8111 Benign paroxysmal vertigo, right ear: Secondary | ICD-10-CM | POA: Diagnosis not present

## 2020-09-05 NOTE — Therapy (Signed)
[facility] [address] [place], [place], [address] Phone: [phone]   Fax:  [phone]  Physical Therapy Treatment  Patient Details  Name: Harold HOUSEWORTH Sr. MRN: [id number] Date of Birth: 1951-01-10 Referring Provider (PT): Dr. Redmond Baseman, MD   Encounter Date: 09/05/2020   PT End of Session - 09/05/20 1012    Visit Number 2    Number of Visits 9    Date for PT Re-Evaluation 10/04/20    Authorization Type UHC Medicare    PT Start Time [phone]    PT Stop Time 1009   left early due to not feeling well   PT Time Calculation (min) 33 min    Activity Tolerance Other (comment)   limited by nausea   Behavior During Therapy [facility] for tasks assessed/performed           Past Medical History:  Diagnosis Date  . Back pain   . Cataract   . Diabetes mellitus without complication ([place])   . Hypertension     Past Surgical History:  Procedure Laterality Date  . COLONOSCOPY  09/13/2014  . COLONOSCOPY  01/12/2020  . EYE SURGERY    . KNEE SURGERY     left knee  . MINOR CARPAL TUNNEL    . POLYPECTOMY    . TONSILLECTOMY      There were no vitals filed for this visit.   Subjective Assessment - 09/05/20 0938    Subjective Didn't feel too woozy after yesterday, was able to drive without difficulty.  Felt dizzy this morning when bending forwards.    Pertinent History back pain, DM, HTN, eye surgery, knee surgery, minor carpal tunnel    Patient Stated Goals Get rid of dizziness, be able to play golf    Currently in Pain? No/denies                   Vestibular Assessment - 09/05/20 0940      Vestibular Assessment   General Observation L anterior canal appears to be resolved today      Positional Testing   Dix-Hallpike --    Sidelying Test Sidelying Right;Sidelying Left      Dix-Hallpike Right   Dix-Hallpike Right Duration --    Dix-Hallpike Right Symptoms --      Dix-Hallpike Left   Dix-Hallpike Left  Duration --    Dix-Hallpike Left Symptoms --      Sidelying Right   Sidelying Right Duration 10 seconds    Sidelying Right Symptoms Downbeat, right rotatory nystagmus   2nd assessment, less duration     Sidelying Left   Sidelying Left Duration 0    Sidelying Left Symptoms No nystagmus                     Vestibular Treatment/Exercise - 09/05/20 0946      Vestibular Treatment/Exercise   Vestibular Treatment Provided Canalith Repositioning    Canalith Repositioning Semont Procedure Right Anterior      Semont Procedure Right Anterior   Number of Reps  2    Overall Response  Improved Symptoms    Response Details  slightly woozy after each treatment; no nausea.  After second treatment pt requested to not perform a third assessment and treatment due to not feeling well.                   PT Education - 09/05/20 1012    Education Details how nystagmus correlates with head movement  and affected side; bilat BPPV    Person(s) Educated Patient    Methods Explanation    Comprehension Verbalized understanding               PT Long Term Goals - 09/04/20 1443      PT LONG TERM GOAL #1   Title Pt will demo independence with progressive HEP.    Baseline NA    Time 4    Period Weeks    Status New    Target Date 10/04/20      PT LONG TERM GOAL #2   Title Pt will increase FOTO score to >/= 71% and will decrease DHI by 12 points    Baseline 60%, DHI: 32    Time 4    Period Weeks    Status New    Target Date 10/04/20      PT LONG TERM GOAL #3   Title Pt will be able to perform self Semont manuever at home in case of dizziness spell.    Baseline NA    Time 4    Period Weeks    Status New    Target Date 10/04/20      PT LONG TERM GOAL #4   Title Pt will demonstrate negative positional testing for all canals    Baseline L anterior canal canalithiasis    Time 4    Period Weeks    Status New    Target Date 10/04/20                 Plan -  09/05/20 1013    Clinical Impression Statement Patient returns with continued c/o dizziness with bending forwards.  Upon reassessment L anterior canal BPPV appeared to be cleared.  Pt continued to present with vertigo and downbeating, R rotary nystagmus with R sidelying test.  Treated x 2 with modified liberatory maneuver for R anterior canal.  After two treatments pt reported feeling woozy and nauseous and requested to stop session in order to avoid getting sick or not being able to drive.  Dizziness appeared to be less intense and shorter duration at second assessment.  Will continue to assess and treat as indicated.    Personal Factors and Comorbidities Age;Time since onset of injury/illness/exacerbation;Social Background    Examination-Activity Limitations Bend;Stand;Bed Mobility    Examination-Participation Restrictions Other;Yard Work;Driving;Cleaning;Community Activity    Stability/Clinical Decision Making Evolving/Moderate complexity    Rehab Potential Good    PT Frequency 2x / week    PT Duration 4 weeks    PT Treatment/Interventions Canalith Repostioning;Balance training;Therapeutic exercise;Neuromuscular re-education;Therapeutic activities;Functional mobility training;Stair training;Gait training;Patient/family education;Vestibular;ADLs/Self Care Home Management;Passive range of motion    PT Next Visit Plan Test in sidelying position due to limited neck extension.  Has bilat anterior canal BPPV.  L seems to be clear, treated R.  Re-check and treat with modified liberatory as indicated.    Consulted and Agree with Plan of Care Patient           Patient will benefit from skilled therapeutic intervention in order to improve the following deficits and impairments:  Dizziness, Decreased range of motion, Decreased balance  Visit Diagnosis: Dizziness and giddiness  BPPV (benign paroxysmal positional vertigo), left  BPPV (benign paroxysmal positional vertigo), right  Unsteadiness on  feet     Problem List Patient Active Problem List   Diagnosis Date Noted  . Diabetes mellitus, type II, insulin dependent ([place]) 07/14/2018  . Diabetic hyperosmolar non-ketotic state ([place]) 07/22/2017  . Hypertension 07/20/2017  .  Infected blister 05/19/2017  . Sensorineural hearing loss (SNHL), bilateral 11/07/2016  . Tinnitus, bilateral 11/07/2016    Rico Junker, PT, DPT 09/05/20    10:19 AM    [facility] [address] [place], [place], [address] Phone: [phone]   Fax:  [phone]  Name: Harold LAGMAN Sr. MRN: [id number] Date of Birth: 1951/07/01

## 2020-09-14 ENCOUNTER — Other Ambulatory Visit: Payer: Self-pay

## 2020-09-14 ENCOUNTER — Encounter: Payer: Self-pay | Admitting: Physical Therapy

## 2020-09-14 ENCOUNTER — Ambulatory Visit: Payer: Medicare Other | Attending: Otolaryngology | Admitting: Physical Therapy

## 2020-09-14 DIAGNOSIS — R2681 Unsteadiness on feet: Secondary | ICD-10-CM | POA: Insufficient documentation

## 2020-09-14 DIAGNOSIS — H8112 Benign paroxysmal vertigo, left ear: Secondary | ICD-10-CM | POA: Insufficient documentation

## 2020-09-14 DIAGNOSIS — R42 Dizziness and giddiness: Secondary | ICD-10-CM | POA: Diagnosis not present

## 2020-09-14 DIAGNOSIS — H8111 Benign paroxysmal vertigo, right ear: Secondary | ICD-10-CM | POA: Diagnosis not present

## 2020-09-14 NOTE — Therapy (Signed)
[facility] [address] [place], [place], [address] Phone: [phone]   Fax:  [phone]  Physical Therapy Treatment  Patient Details  Name: Harold PECH Sr. MRN: [id number] Date of Birth: [birth year] Referring Provider (PT): Dr. Redmond Baseman, MD   Encounter Date: 09/14/2020   PT End of Session - 09/14/20 1139    Visit Number 3    Number of Visits 9    Date for PT Re-Evaluation 10/04/20    Authorization Type UHC Medicare    PT Start Time 1018    PT Stop Time 1050    PT Time Calculation (min) 32 min    Activity Tolerance Patient tolerated treatment well    Behavior During Therapy [facility] for tasks assessed/performed           Past Medical History:  Diagnosis Date  . Back pain   . Cataract   . Diabetes mellitus without complication ([place])   . Hypertension     Past Surgical History:  Procedure Laterality Date  . COLONOSCOPY  09/13/2014  . COLONOSCOPY  01/12/2020  . EYE SURGERY    . KNEE SURGERY     left knee  . MINOR CARPAL TUNNEL    . POLYPECTOMY    . TONSILLECTOMY      There were no vitals filed for this visit.   Subjective Assessment - 09/14/20 1024    Subjective Pt reports he has intentionally not done any movements that may provoke the dizziness; pt says that stretching in the mornings will provoke the dizziness, but has not done anything to bring on the dizziness    Pertinent History back pain, DM, HTN, eye surgery, knee surgery, minor carpal tunnel    Patient Stated Goals Get rid of dizziness, be able to play golf    Currently in Pain? No/denies                   Vestibular Assessment - 09/14/20 1028      Positional Testing   Dix-Hallpike Dix-Hallpike Right;Dix-Hallpike Left    Sidelying Test Sidelying Right;Sidelying Left    Horizontal Canal Testing Horizontal Canal Right;Horizontal Canal Left      Dix-Hallpike Right   Dix-Hallpike Right Duration none    Dix-Hallpike Right  Symptoms No nystagmus   pt reported mild light-headedness with return to sitting     Dix-Hallpike Left   Dix-Hallpike Left Duration none    Dix-Hallpike Left Symptoms No nystagmus      Sidelying Right   Sidelying Right Duration none    Sidelying Right Symptoms No nystagmus      Sidelying Left   Sidelying Left Duration none    Sidelying Left Symptoms No nystagmus      Horizontal Canal Right   Horizontal Canal Right Duration none    Horizontal Canal Right Symptoms Normal      Horizontal Canal Left   Horizontal Canal Left Duration none    Horizontal Canal Left Symptoms Normal                            PT Education - 09/14/20 1137    Education Details verbally instructed pt in Semont maneuver (per LTG) and also in Brandt-Daroff exercises - pt declined pictures of both of these; offered pt article on BPPV etiology from [place] and he declined this also    Person(s) Educated Patient    Methods Explanation   handouts offered - declined  Comprehension Verbalized understanding;Returned demonstration               PT Long Term Goals - 09/14/20 1145      PT LONG TERM GOAL #1   Title Pt will demo independence with progressive HEP.    Baseline NA    Time 4    Period Weeks    Status New      PT LONG TERM GOAL #2   Title Pt will increase FOTO score to >/= 71% and will decrease DHI by 12 points    Baseline 60%, DHI: 32    Time 4    Period Weeks    Status New      PT LONG TERM GOAL #3   Title Pt will be able to perform self Semont manuever at home in case of dizziness spell.    Baseline NA    Time 4    Period Weeks    Status New      PT LONG TERM GOAL #4   Title Pt will demonstrate negative positional testing for all canals    Baseline L anterior canal canalithiasis    Time 4    Period Weeks    Status New                 Plan - 09/14/20 1023    Clinical Impression Statement All positional testing was (-) today with no nystagmus and no  vertigo provoked with any movements.  Simulated pt awakening in am and stretching - pt able to perform this simulated movement with no prvocation of vertigo.  BPPV appears to be resolved at this time; recommended that pt not avoid any movements and see if vertigo occurs.  Plan D/C next session if pt continues to report no vertigo.    Personal Factors and Comorbidities Age;Time since onset of injury/illness/exacerbation;Social Background    Examination-Activity Limitations Bend;Stand;Bed Mobility    Examination-Participation Restrictions Other;Yard Work;Driving;Cleaning;Community Activity    Stability/Clinical Decision Making Evolving/Moderate complexity    Rehab Potential Good    PT Frequency 2x / week    PT Duration 4 weeks    PT Treatment/Interventions Canalith Repostioning;Balance training;Therapeutic exercise;Neuromuscular re-education;Therapeutic activities;Functional mobility training;Stair training;Gait training;Patient/family education;Vestibular;ADLs/Self Care Home Management;Passive range of motion    PT Next Visit Plan Assess Rt BPPV (anterior canal per eval notes);  no vertigo reported on 09-14-20 - plan D/C next session if pt continues to report no episodes of vertigo experienced in upcoming week -- complete D/C FOTO if pt is D/C'd    Consulted and Agree with Plan of Care Patient           Patient will benefit from skilled therapeutic intervention in order to improve the following deficits and impairments:  Dizziness, Decreased range of motion, Decreased balance  Visit Diagnosis: BPPV (benign paroxysmal positional vertigo), right     Problem List Patient Active Problem List   Diagnosis Date Noted  . Diabetes mellitus, type II, insulin dependent ([place]) 07/14/2018  . Diabetic hyperosmolar non-ketotic state ([place]) 07/22/2017  . Hypertension 07/20/2017  . Infected blister 05/19/2017  . Sensorineural hearing loss (SNHL), bilateral 11/07/2016  . Tinnitus, bilateral 11/07/2016     [name], PT 09/14/2020, 11:50 AM  [facility] [address] [place], [place], [address] Phone: [phone]   Fax:  [phone]  Name: Harold FRAYRE Sr. MRN: [id number] Date of Birth: Jun 05, 1951

## 2020-09-18 ENCOUNTER — Ambulatory Visit: Payer: Medicare Other

## 2020-09-22 ENCOUNTER — Other Ambulatory Visit: Payer: Self-pay

## 2020-09-22 ENCOUNTER — Ambulatory Visit: Payer: Medicare Other | Admitting: Physical Therapy

## 2020-09-22 DIAGNOSIS — H8112 Benign paroxysmal vertigo, left ear: Secondary | ICD-10-CM | POA: Diagnosis not present

## 2020-09-22 DIAGNOSIS — H8111 Benign paroxysmal vertigo, right ear: Secondary | ICD-10-CM | POA: Diagnosis not present

## 2020-09-22 DIAGNOSIS — R2681 Unsteadiness on feet: Secondary | ICD-10-CM | POA: Diagnosis not present

## 2020-09-22 DIAGNOSIS — R42 Dizziness and giddiness: Secondary | ICD-10-CM

## 2020-09-22 NOTE — Therapy (Signed)
[facility] [address] [place], [place], [address] Phone: [phone]   Fax:  [phone]  Physical Therapy Treatment and Discharge  Patient Details  Name: Harold HARDGE Sr. MRN: [id number] Date of Birth: [birth year] Referring Provider (PT): Dr. Redmond Baseman, MD   PHYSICAL THERAPY DISCHARGE SUMMARY  Visits from Start of Care: 4  Current functional level related to goals / functional outcomes: See below   Remaining deficits: See below   Education / Equipment: Reinforced how to check for BPPV and Semont maneuver  Plan: Patient agrees to discharge.  Patient goals were met. Patient is being discharged due to meeting the stated rehab goals.  ?????        Encounter Date: 09/22/2020   PT End of Session - 09/22/20 0956    Visit Number 4    Number of Visits 9    Date for PT Re-Evaluation 10/04/20    Authorization Type UHC Medicare    PT Start Time 0935    PT Stop Time 1000    PT Time Calculation (min) 25 min    Activity Tolerance Patient tolerated treatment well    Behavior During Therapy [facility] for tasks assessed/performed           Past Medical History:  Diagnosis Date  . Back pain   . Cataract   . Diabetes mellitus without complication ([place])   . Hypertension     Past Surgical History:  Procedure Laterality Date  . COLONOSCOPY  09/13/2014  . COLONOSCOPY  01/12/2020  . EYE SURGERY    . KNEE SURGERY     left knee  . MINOR CARPAL TUNNEL    . POLYPECTOMY    . TONSILLECTOMY      There were no vitals filed for this visit.   Subjective Assessment - 09/22/20 0938    Subjective Pt states he has no instances of vertigo. Pt states he thought he was going to get dizzy when he tied his shoes but he didn't.    Pertinent History back pain, DM, HTN, eye surgery, knee surgery, minor carpal tunnel    Patient Stated Goals Get rid of dizziness, be able to play golf    Currently in Pain? No/denies               [facility] PT Assessment - 09/22/20 0001      Observation/Other Assessments   Focus on Therapeutic Outcomes (FOTO)  64%    Dizziness Handicap Inventory [facility])  16               Vestibular Assessment - 09/22/20 0001      Dix-Hallpike Right   Dix-Hallpike Right Duration none    Dix-Hallpike Right Symptoms No nystagmus      Dix-Hallpike Left   Dix-Hallpike Left Duration none    Dix-Hallpike Left Symptoms No nystagmus      Sidelying Right   Sidelying Right Duration none    Sidelying Right Symptoms No nystagmus      Sidelying Left   Sidelying Left Duration none    Sidelying Left Symptoms No nystagmus      Horizontal Canal Right   Horizontal Canal Right Duration none    Horizontal Canal Right Symptoms Normal      Horizontal Canal Left   Horizontal Canal Left Duration none    Horizontal Canal Left Symptoms Normal  PT Education - 09/22/20 0956    Education Details Reinforced Semont maneuver. Declined handout/pictures and verbalized understanding.    Person(s) Educated Patient    Methods Explanation    Comprehension Verbalized understanding;Returned demonstration               PT Long Term Goals - 09/22/20 0956      PT LONG TERM GOAL #1   Title Pt will demo independence with progressive HEP.    Baseline NA    Time 4    Period Weeks    Status Achieved      PT LONG TERM GOAL #2   Title Pt will increase FOTO score to >/= 71% and will decrease DHI by 12 points    Baseline 60%, DHI: 32; 64%, DHI: 16    Time 4    Period Weeks    Status Partially Met      PT LONG TERM GOAL #3   Title Pt will be able to perform self Semont manuever at home in case of dizziness spell.    Baseline NA    Time 4    Period Weeks    Status Achieved      PT LONG TERM GOAL #4   Title Pt will demonstrate negative positional testing for all canals    Baseline L anterior canal canalithiasis    Time 4    Period Weeks    Status  Achieved                 Plan - 09/22/20 0953    Clinical Impression Statement All positional testing remained (-) today with no nystagmus, vertigo, or symptoms of dizziness. Reinforced to pt to not avoid head movements. Discussed being intentional with checking for his BPPV and reinforced how to perform self canalith repositioning with Semont and Nestor Lewandowsky maneuvers. Pt able to demo good understanding. Pt has met or partially met all LTGs (did not fully meet FOTO goals but met [place] goal) and will be d/c-ed at this time.    Personal Factors and Comorbidities Age;Time since onset of injury/illness/exacerbation;Social Background    Examination-Activity Limitations Bend;Stand;Bed Mobility    Examination-Participation Restrictions Other;Yard Work;Driving;Cleaning;Community Activity    Stability/Clinical Decision Making Evolving/Moderate complexity    Rehab Potential Good    PT Frequency 2x / week    PT Duration 4 weeks    PT Treatment/Interventions Canalith Repostioning;Balance training;Therapeutic exercise;Neuromuscular re-education;Therapeutic activities;Functional mobility training;Stair training;Gait training;Patient/family education;Vestibular;ADLs/Self Care Home Management;Passive range of motion    PT Next Visit Plan Assess Rt BPPV (anterior canal per eval notes);  no vertigo reported on 09-14-20 - plan D/C next session if pt continues to report no episodes of vertigo experienced in upcoming week -- complete D/C FOTO if pt is D/C'd    Consulted and Agree with Plan of Care Patient           Patient will benefit from skilled therapeutic intervention in order to improve the following deficits and impairments:  Dizziness,Decreased range of motion,Decreased balance  Visit Diagnosis: BPPV (benign paroxysmal positional vertigo), right  Dizziness and giddiness  BPPV (benign paroxysmal positional vertigo), left  Unsteadiness on feet     Problem List Patient Active Problem List    Diagnosis Date Noted  . Diabetes mellitus, type II, insulin dependent ([place]) 07/14/2018  . Diabetic hyperosmolar non-ketotic state ([place]) 07/22/2017  . Hypertension 07/20/2017  . Infected blister 05/19/2017  . Sensorineural hearing loss (SNHL), bilateral 11/07/2016  . Tinnitus, bilateral 11/07/2016    Estill Bamberg  April Ma L Diksha Tagliaferro PT, DPT 09/22/2020, 10:07 AM  [facility] [address] [place], [place], [address] Phone: [phone]   Fax:  [phone]  Name: Harold KIEL Sr. MRN: [id number] Date of Birth: 06-Sep-1951

## 2020-09-25 ENCOUNTER — Ambulatory Visit: Payer: Medicare Other | Admitting: Physical Therapy

## 2020-09-28 ENCOUNTER — Encounter: Payer: Medicare Other | Admitting: Physical Therapy

## 2020-09-28 DIAGNOSIS — R110 Nausea: Secondary | ICD-10-CM | POA: Diagnosis not present

## 2020-09-28 DIAGNOSIS — E876 Hypokalemia: Secondary | ICD-10-CM | POA: Diagnosis not present

## 2020-09-28 DIAGNOSIS — I169 Hypertensive crisis, unspecified: Secondary | ICD-10-CM | POA: Diagnosis not present

## 2020-09-28 DIAGNOSIS — R1084 Generalized abdominal pain: Secondary | ICD-10-CM | POA: Diagnosis not present

## 2020-09-28 DIAGNOSIS — K5730 Diverticulosis of large intestine without perforation or abscess without bleeding: Secondary | ICD-10-CM | POA: Diagnosis not present

## 2020-09-28 DIAGNOSIS — I10 Essential (primary) hypertension: Secondary | ICD-10-CM | POA: Diagnosis not present

## 2020-09-28 DIAGNOSIS — R109 Unspecified abdominal pain: Secondary | ICD-10-CM | POA: Diagnosis not present

## 2020-09-28 DIAGNOSIS — R112 Nausea with vomiting, unspecified: Secondary | ICD-10-CM | POA: Diagnosis not present

## 2020-09-28 DIAGNOSIS — R9431 Abnormal electrocardiogram [ECG] [EKG]: Secondary | ICD-10-CM | POA: Diagnosis not present

## 2020-09-28 DIAGNOSIS — E089 Diabetes mellitus due to underlying condition without complications: Secondary | ICD-10-CM | POA: Diagnosis not present

## 2020-09-28 DIAGNOSIS — Z794 Long term (current) use of insulin: Secondary | ICD-10-CM | POA: Diagnosis not present

## 2020-09-28 DIAGNOSIS — R0789 Other chest pain: Secondary | ICD-10-CM | POA: Diagnosis not present

## 2020-09-28 DIAGNOSIS — E1165 Type 2 diabetes mellitus with hyperglycemia: Secondary | ICD-10-CM | POA: Diagnosis not present

## 2020-09-28 DIAGNOSIS — K5790 Diverticulosis of intestine, part unspecified, without perforation or abscess without bleeding: Secondary | ICD-10-CM | POA: Diagnosis not present

## 2020-09-28 DIAGNOSIS — I214 Non-ST elevation (NSTEMI) myocardial infarction: Secondary | ICD-10-CM | POA: Diagnosis not present

## 2020-10-02 ENCOUNTER — Encounter: Payer: Medicare Other | Admitting: Physical Therapy

## 2020-10-06 DIAGNOSIS — R112 Nausea with vomiting, unspecified: Secondary | ICD-10-CM | POA: Diagnosis not present

## 2020-10-06 DIAGNOSIS — E876 Hypokalemia: Secondary | ICD-10-CM | POA: Diagnosis not present

## 2020-10-06 DIAGNOSIS — H8309 Labyrinthitis, unspecified ear: Secondary | ICD-10-CM | POA: Diagnosis not present

## 2020-10-06 DIAGNOSIS — E1169 Type 2 diabetes mellitus with other specified complication: Secondary | ICD-10-CM | POA: Diagnosis not present

## 2020-10-16 DIAGNOSIS — Z794 Long term (current) use of insulin: Secondary | ICD-10-CM | POA: Diagnosis not present

## 2020-10-16 DIAGNOSIS — M130 Polyarthritis, unspecified: Secondary | ICD-10-CM | POA: Diagnosis not present

## 2020-10-16 DIAGNOSIS — I10 Essential (primary) hypertension: Secondary | ICD-10-CM | POA: Diagnosis not present

## 2020-10-16 DIAGNOSIS — E1169 Type 2 diabetes mellitus with other specified complication: Secondary | ICD-10-CM | POA: Diagnosis not present

## 2020-10-16 DIAGNOSIS — E119 Type 2 diabetes mellitus without complications: Secondary | ICD-10-CM | POA: Diagnosis not present

## 2020-10-16 DIAGNOSIS — Z7984 Long term (current) use of oral hypoglycemic drugs: Secondary | ICD-10-CM | POA: Diagnosis not present

## 2020-10-16 DIAGNOSIS — Z961 Presence of intraocular lens: Secondary | ICD-10-CM | POA: Diagnosis not present

## 2020-10-20 DIAGNOSIS — E089 Diabetes mellitus due to underlying condition without complications: Secondary | ICD-10-CM | POA: Diagnosis not present

## 2020-10-20 DIAGNOSIS — R1084 Generalized abdominal pain: Secondary | ICD-10-CM | POA: Diagnosis not present

## 2020-10-31 DIAGNOSIS — I10 Essential (primary) hypertension: Secondary | ICD-10-CM | POA: Diagnosis not present

## 2020-10-31 DIAGNOSIS — E1169 Type 2 diabetes mellitus with other specified complication: Secondary | ICD-10-CM | POA: Diagnosis not present

## 2020-10-31 DIAGNOSIS — H9313 Tinnitus, bilateral: Secondary | ICD-10-CM | POA: Diagnosis not present

## 2020-11-13 DIAGNOSIS — R42 Dizziness and giddiness: Secondary | ICD-10-CM | POA: Diagnosis not present

## 2020-12-29 ENCOUNTER — Other Ambulatory Visit: Payer: Self-pay | Admitting: Family Medicine

## 2020-12-29 ENCOUNTER — Other Ambulatory Visit: Payer: Self-pay

## 2020-12-29 ENCOUNTER — Ambulatory Visit
Admission: RE | Admit: 2020-12-29 | Discharge: 2020-12-29 | Disposition: A | Discharge status: Home / Self Care | Payer: Medicare Other | Source: Ambulatory Visit | Attending: Family Medicine | Admitting: Family Medicine

## 2020-12-29 DIAGNOSIS — M545 Low back pain, unspecified: Secondary | ICD-10-CM | POA: Diagnosis not present

## 2020-12-29 DIAGNOSIS — I10 Essential (primary) hypertension: Secondary | ICD-10-CM | POA: Diagnosis not present

## 2020-12-29 DIAGNOSIS — M5431 Sciatica, right side: Secondary | ICD-10-CM

## 2020-12-29 DIAGNOSIS — E1169 Type 2 diabetes mellitus with other specified complication: Secondary | ICD-10-CM | POA: Diagnosis not present

## 2020-12-29 IMAGING — CR DG LUMBAR SPINE COMPLETE 4+V
5 series · 5 of 5 positions shown · non-contrast
Comparison: [DATE]

CLINICAL DATA: Pain

EXAM:
LUMBAR SPINE - COMPLETE 4+ VIEW

[t lumbar spine ap]
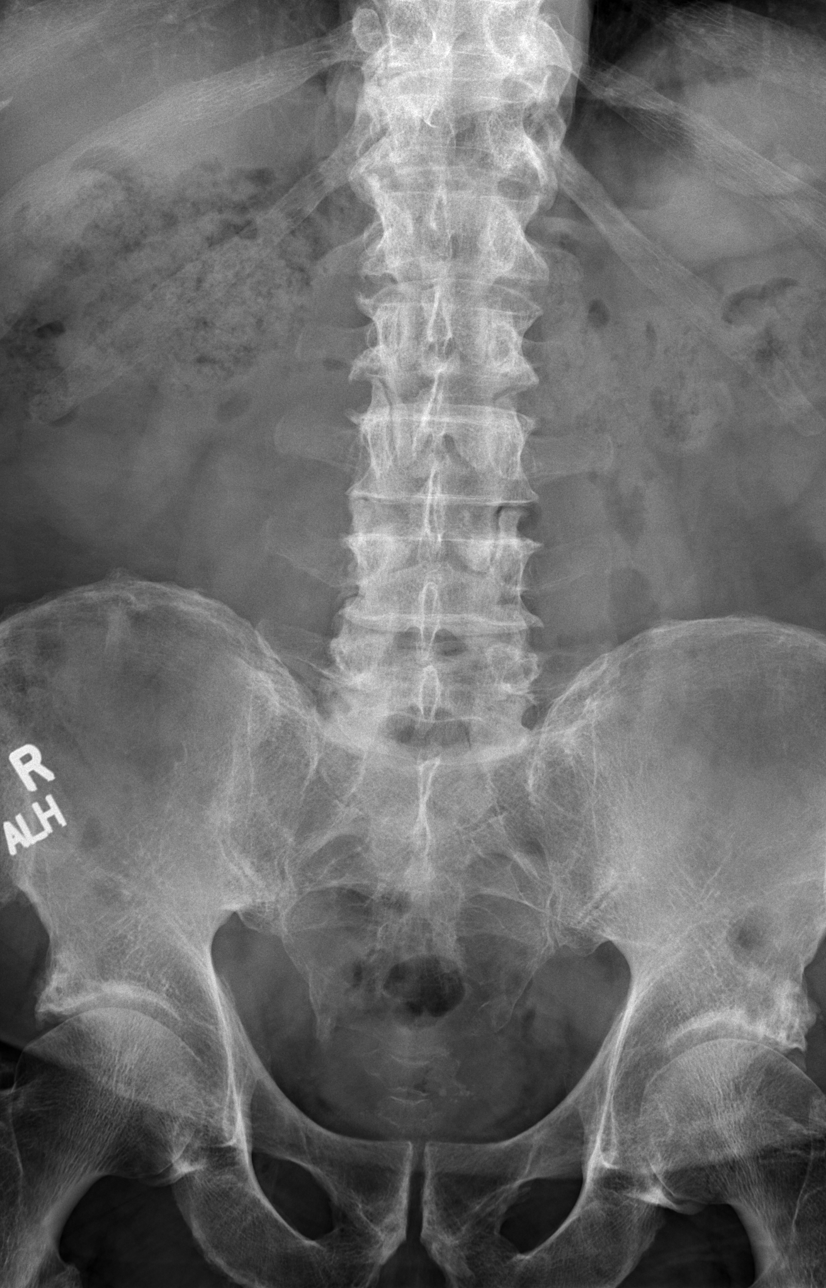

[t lumbar spine obl (1 of 2)]
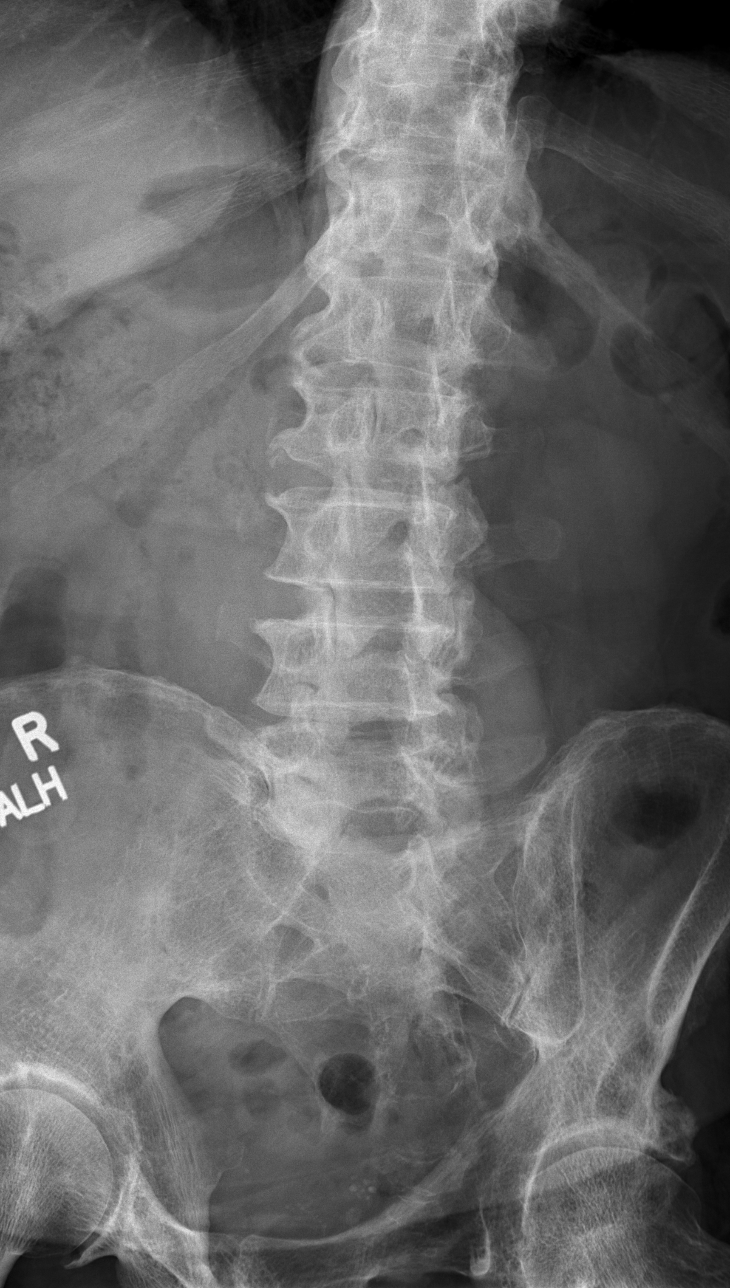

[t lumbar spine obl (2 of 2)]
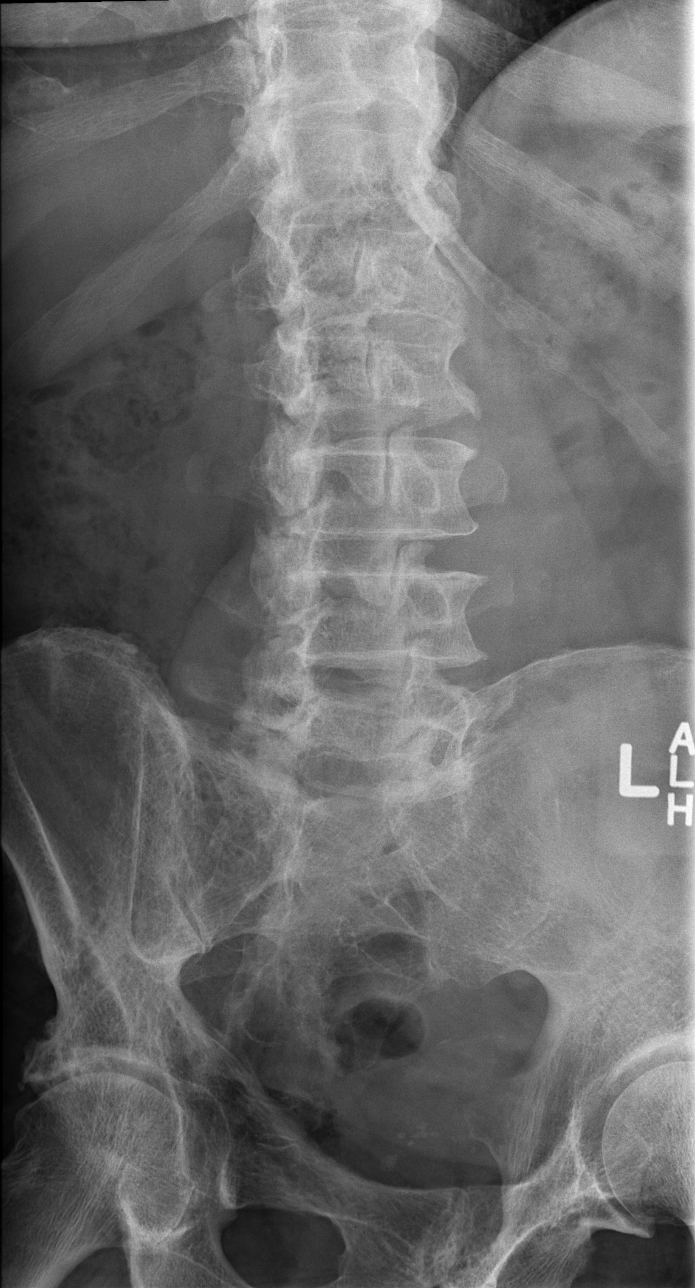

[t lumbar spine lat]
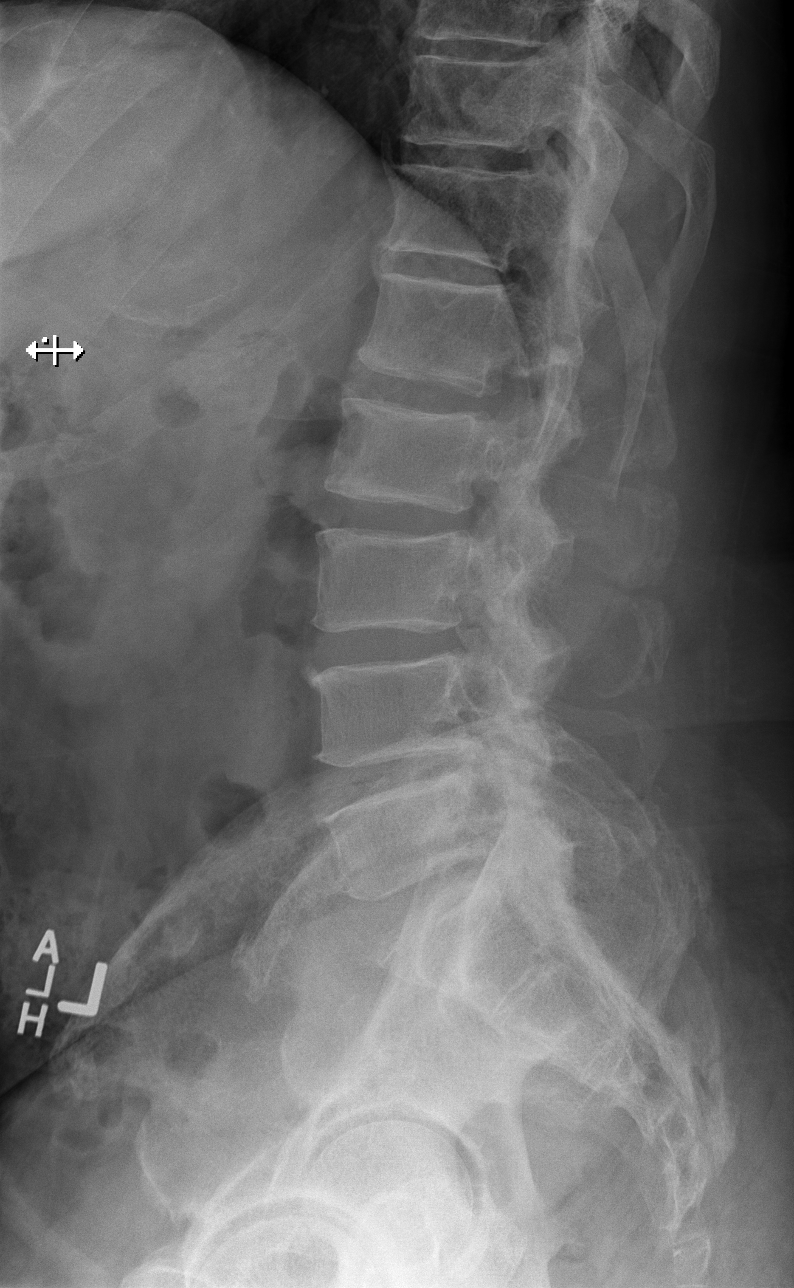

[t lumbar l-5 s-1 spot]
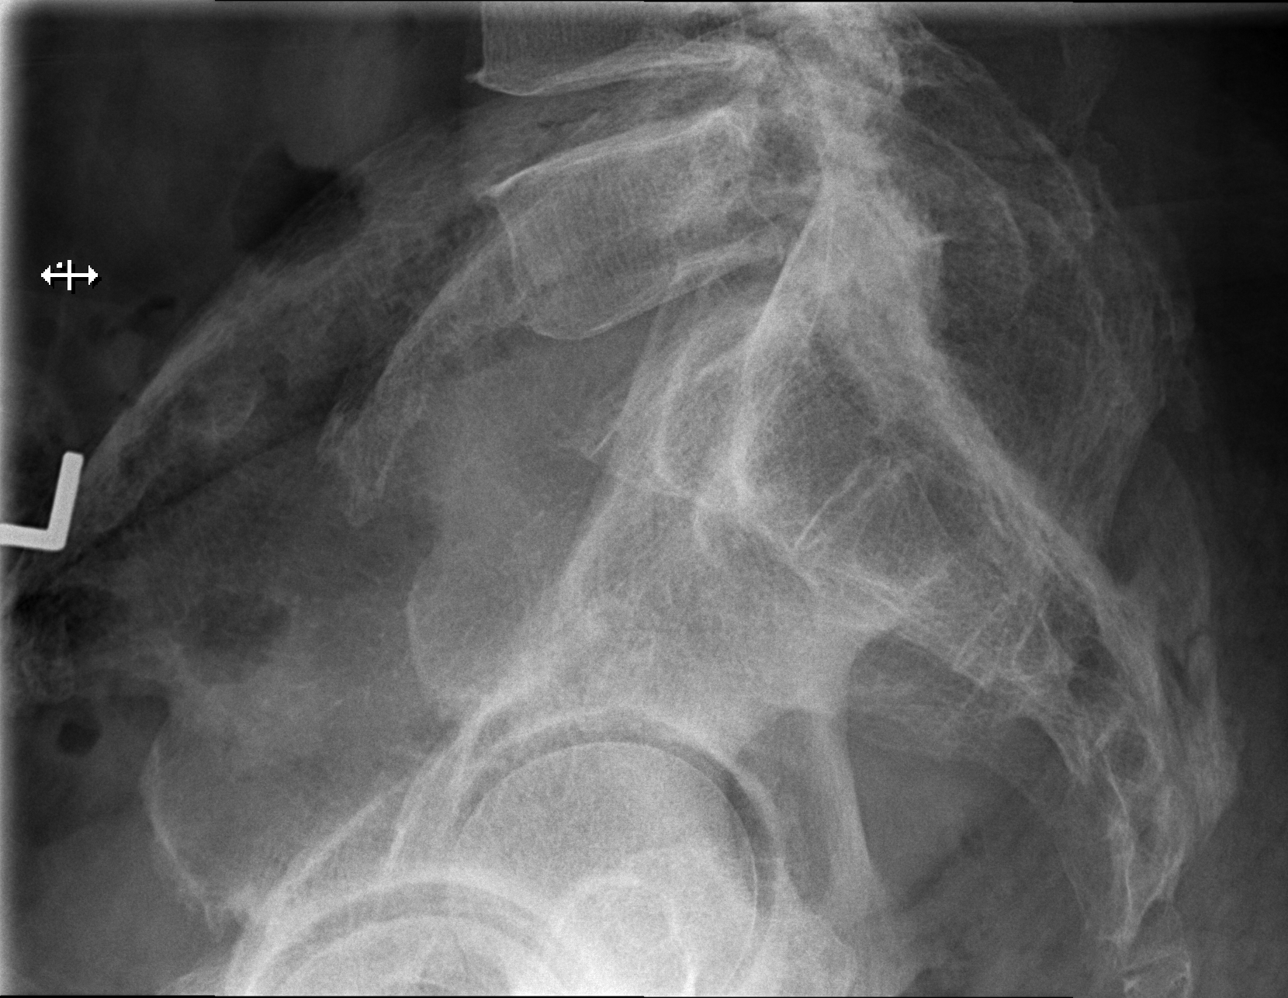

[5 of 5 positions shown; findings below may reference images not displayed]

FINDINGS: There are five non-rib bearing lumbar-type vertebral bodies. There
is normal alignment. There is no evidence for acute fracture or
subluxation. Mild intervertebral disc space height loss at L4-5.
Multilevel endplate proliferative changes. Lower lung burr [DATE]
arthropathy, RIGHT greater than LEFT. Visualized abdomen is
unremarkable.
IMPRESSION: No acute findings in the lumbar spine. Mild degenerative disc
disease most pronounced at L4-5.

## 2021-01-03 DIAGNOSIS — H8113 Benign paroxysmal vertigo, bilateral: Secondary | ICD-10-CM | POA: Diagnosis not present

## 2021-01-08 DIAGNOSIS — E1169 Type 2 diabetes mellitus with other specified complication: Secondary | ICD-10-CM | POA: Diagnosis not present

## 2021-01-08 DIAGNOSIS — M549 Dorsalgia, unspecified: Secondary | ICD-10-CM | POA: Diagnosis not present

## 2021-01-08 DIAGNOSIS — M130 Polyarthritis, unspecified: Secondary | ICD-10-CM | POA: Diagnosis not present

## 2021-01-08 DIAGNOSIS — I10 Essential (primary) hypertension: Secondary | ICD-10-CM | POA: Diagnosis not present

## 2021-01-08 DIAGNOSIS — E1165 Type 2 diabetes mellitus with hyperglycemia: Secondary | ICD-10-CM | POA: Diagnosis not present

## 2021-01-08 DIAGNOSIS — M5431 Sciatica, right side: Secondary | ICD-10-CM | POA: Diagnosis not present

## 2021-01-30 ENCOUNTER — Emergency Department (HOSPITAL_COMMUNITY): Payer: Medicare Other

## 2021-01-30 ENCOUNTER — Emergency Department (HOSPITAL_COMMUNITY)
Admission: EM | Admit: 2021-01-30 | Discharge: 2021-01-30 | Disposition: A | Discharge status: Home / Self Care | Payer: Medicare Other | Attending: Emergency Medicine | Admitting: Emergency Medicine

## 2021-01-30 DIAGNOSIS — R109 Unspecified abdominal pain: Secondary | ICD-10-CM | POA: Diagnosis not present

## 2021-01-30 DIAGNOSIS — Z79899 Other long term (current) drug therapy: Secondary | ICD-10-CM | POA: Insufficient documentation

## 2021-01-30 DIAGNOSIS — R42 Dizziness and giddiness: Secondary | ICD-10-CM | POA: Diagnosis not present

## 2021-01-30 DIAGNOSIS — Z7984 Long term (current) use of oral hypoglycemic drugs: Secondary | ICD-10-CM | POA: Diagnosis not present

## 2021-01-30 DIAGNOSIS — R61 Generalized hyperhidrosis: Secondary | ICD-10-CM | POA: Diagnosis not present

## 2021-01-30 DIAGNOSIS — R112 Nausea with vomiting, unspecified: Secondary | ICD-10-CM | POA: Diagnosis not present

## 2021-01-30 DIAGNOSIS — I10 Essential (primary) hypertension: Secondary | ICD-10-CM | POA: Insufficient documentation

## 2021-01-30 DIAGNOSIS — E119 Type 2 diabetes mellitus without complications: Secondary | ICD-10-CM | POA: Insufficient documentation

## 2021-01-30 DIAGNOSIS — Z87891 Personal history of nicotine dependence: Secondary | ICD-10-CM | POA: Insufficient documentation

## 2021-01-30 DIAGNOSIS — G319 Degenerative disease of nervous system, unspecified: Secondary | ICD-10-CM | POA: Diagnosis not present

## 2021-01-30 LAB — CBC WITH DIFFERENTIAL/PLATELET
Abs Immature Granulocytes: 0.04 10*3/uL (ref 0.00–0.07)
Basophils Absolute: 0 10*3/uL (ref 0.0–0.1)
Basophils Relative: 0 %
Eosinophils Absolute: 0 10*3/uL (ref 0.0–0.5)
Eosinophils Relative: 0 %
HCT: 44.9 % (ref 39.0–52.0)
Hemoglobin: 15.2 g/dL (ref 13.0–17.0)
Immature Granulocytes: 0 %
Lymphocytes Relative: 13 %
Lymphs Abs: 1.2 10*3/uL (ref 0.7–4.0)
MCH: 31.9 pg (ref 26.0–34.0)
MCHC: 33.9 g/dL (ref 30.0–36.0)
MCV: 94.3 fL (ref 80.0–100.0)
Monocytes Absolute: 0.3 10*3/uL (ref 0.1–1.0)
Monocytes Relative: 4 %
Neutro Abs: 7.7 10*3/uL (ref 1.7–7.7)
Neutrophils Relative %: 83 %
Platelets: 238 10*3/uL (ref 150–400)
RBC: 4.76 MIL/uL (ref 4.22–5.81)
RDW: 12.1 % (ref 11.5–15.5)
WBC: 9.3 10*3/uL (ref 4.0–10.5)
nRBC: 0 % (ref 0.0–0.2)

## 2021-01-30 LAB — COMPREHENSIVE METABOLIC PANEL
ALT: 42 U/L (ref 0–44)
AST: 53 U/L — ABNORMAL HIGH (ref 15–41)
Albumin: 3.9 g/dL (ref 3.5–5.0)
Alkaline Phosphatase: 52 U/L (ref 38–126)
Anion gap: 13 (ref 5–15)
BUN: 9 mg/dL (ref 8–23)
CO2: 26 mmol/L (ref 22–32)
Calcium: 10.1 mg/dL (ref 8.9–10.3)
Chloride: 98 mmol/L (ref 98–111)
Creatinine, Ser: 0.97 mg/dL (ref 0.61–1.24)
GFR, Estimated: >60 mL/min (ref >60)
Glucose, Bld: 157 mg/dL — ABNORMAL HIGH (ref 70–99)
Potassium: 4.4 mmol/L (ref 3.5–5.1)
Sodium: 137 mmol/L (ref 135–145)
Total Bilirubin: 0.9 mg/dL (ref 0.3–1.2)
Total Protein: 8.1 g/dL (ref 6.5–8.1)

## 2021-01-30 IMAGING — MR MR HEAD W/O CM
7 of 11 series · 25 of 48 positions shown · non-contrast
Comparison: Head CT [DATE]

CLINICAL DATA: Dizziness.

EXAM:
MRI HEAD WITHOUT CONTRAST
TECHNIQUE: Multiplanar, multiecho pulse sequences of the brain and surrounding
structures were obtained without intravenous contrast.

[Series 2: DWI · axial · 3.0mm · 0.94mm/px · z∈[-69,+94]mm · 7 of 112 slices shown (1 of 2)]
[im 1/112]
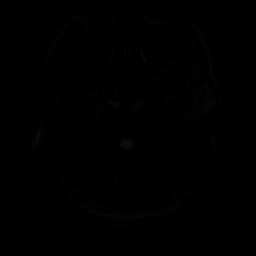
[im 19/112]
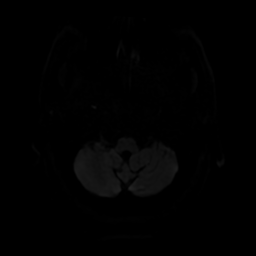
[im 38/112]
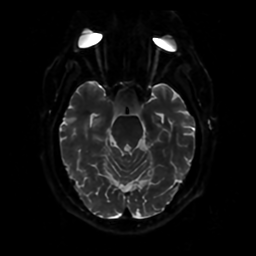
[im 56/112]
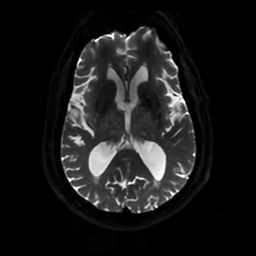
[im 75/112]
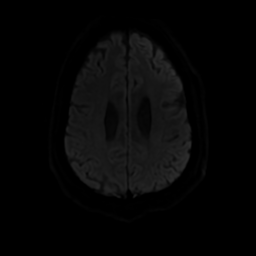
[im 93/112]
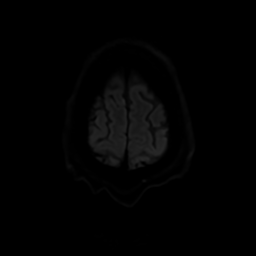
[im 112/112]
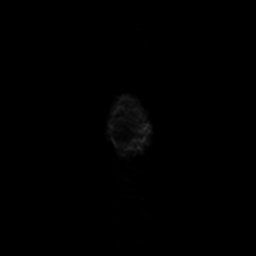

[Series 3: DWI · coronal · 4.0mm · 0.94mm/px · 6 of 80 slices shown (2 of 2)]
[im 1/80]
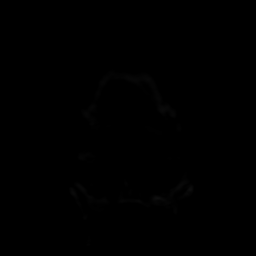
[im 16/80]
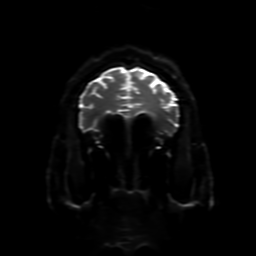
[im 32/80]
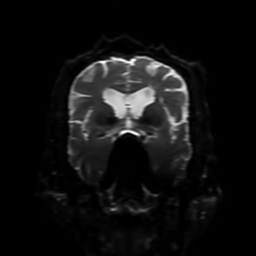
[im 48/80]
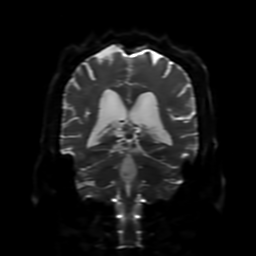
[im 64/80]
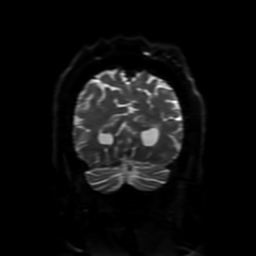
[im 80/80]
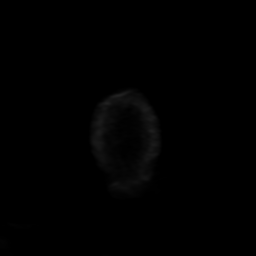

[Series 4: FLAIR · sagittal · 5.0mm · 0.23mm/px · 2 of 28 slices shown (1 of 2)]
[im 1/28]
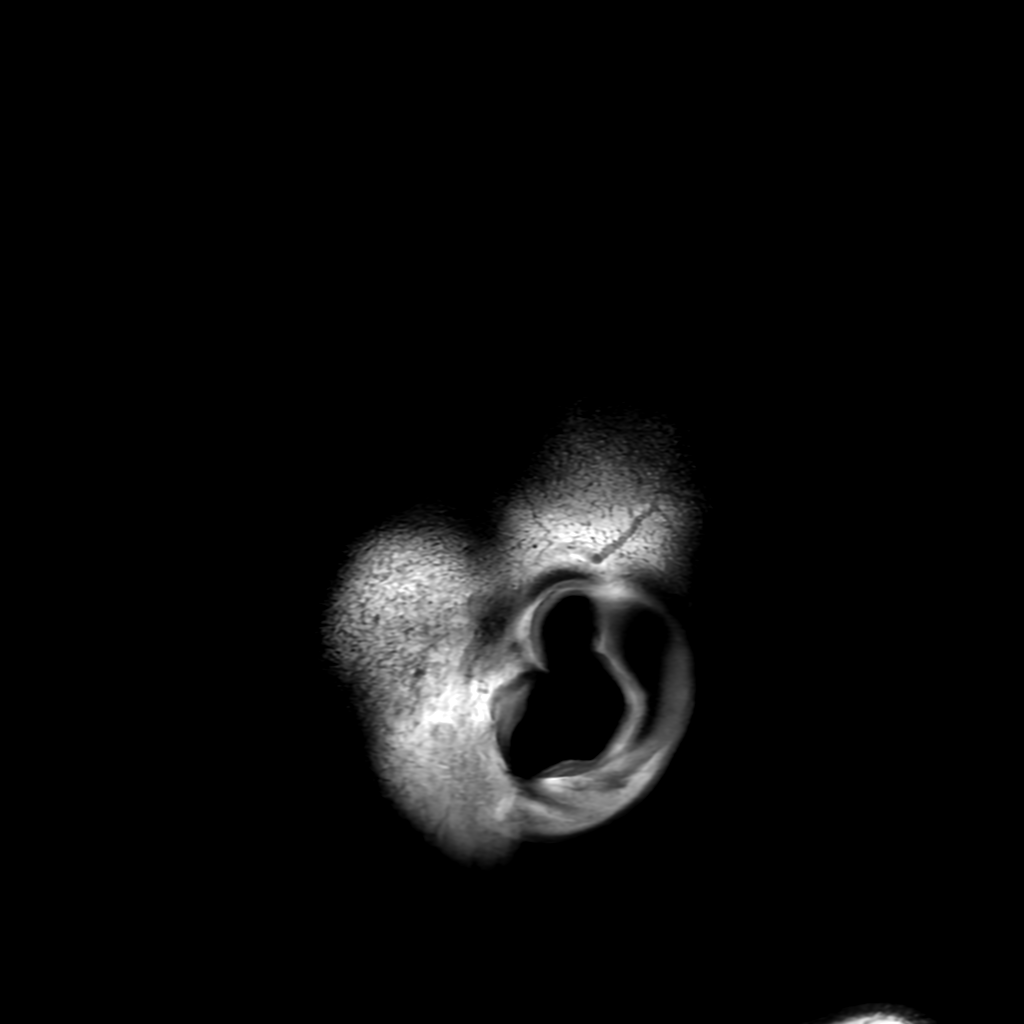
[im 28/28]
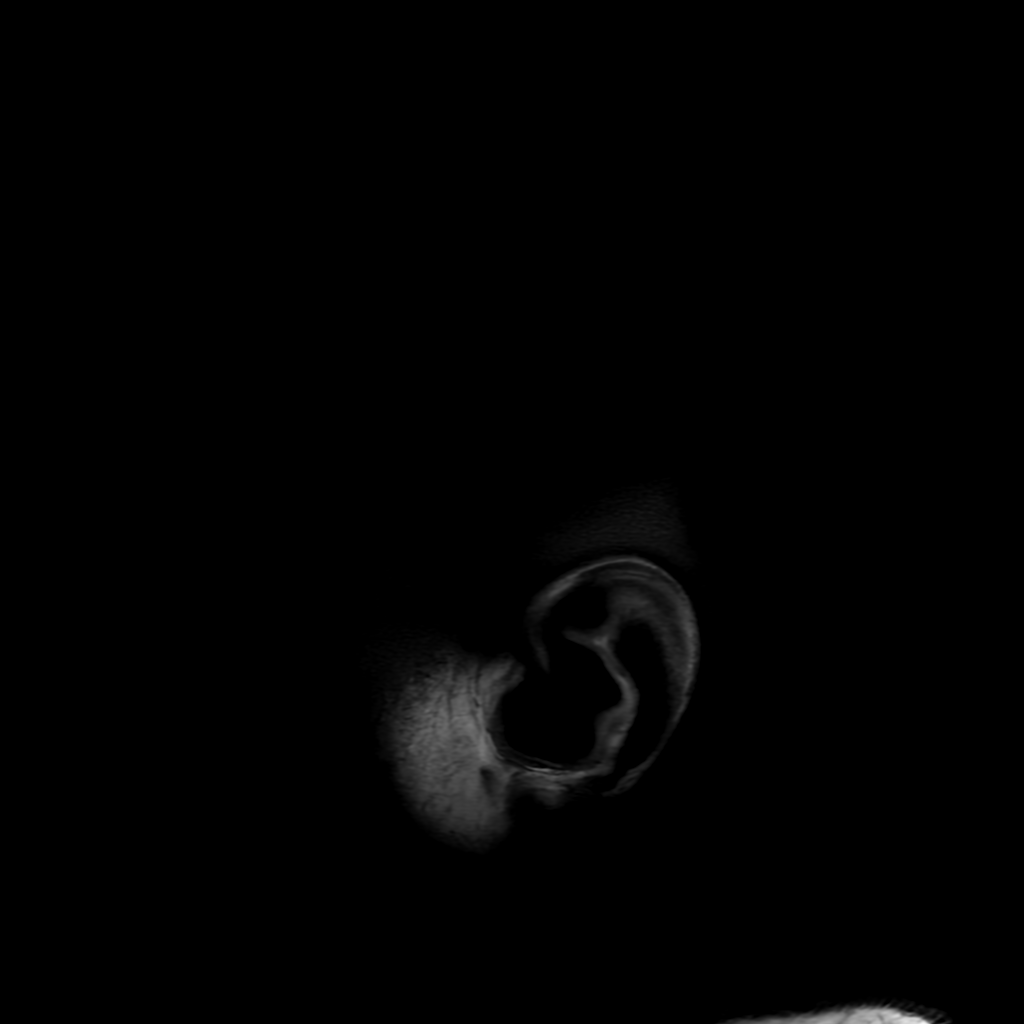

[Series 5: T2 · axial · 5.0mm · 0.23mm/px · 1 of 29 slices shown]
[im 1/29]
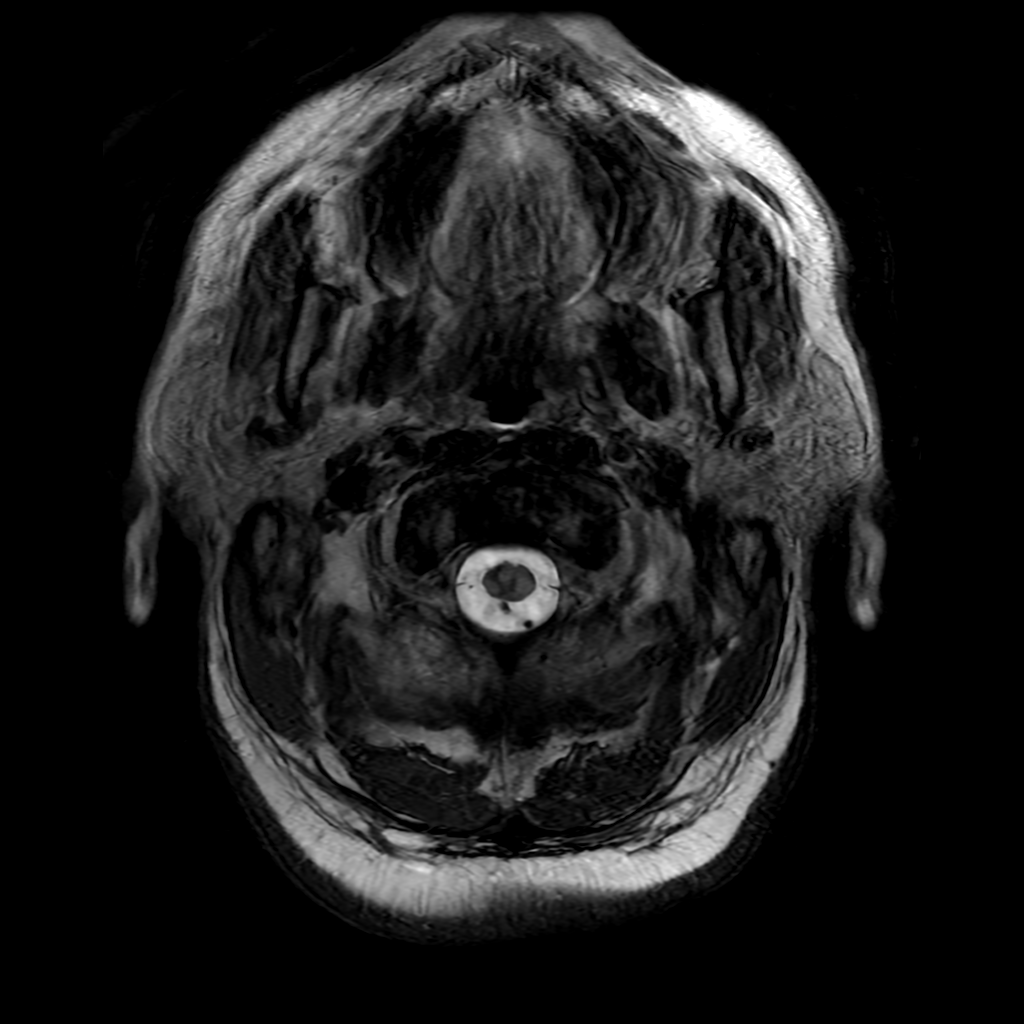

[Series 6: FLAIR · axial · 3.0mm · 0.45mm/px · z∈[-81,+81]mm · 2 of 29 slices shown (2 of 2)]
[im 1/29]
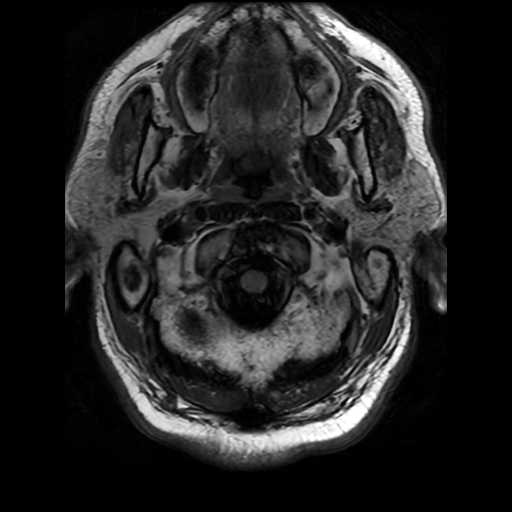
[im 29/29]
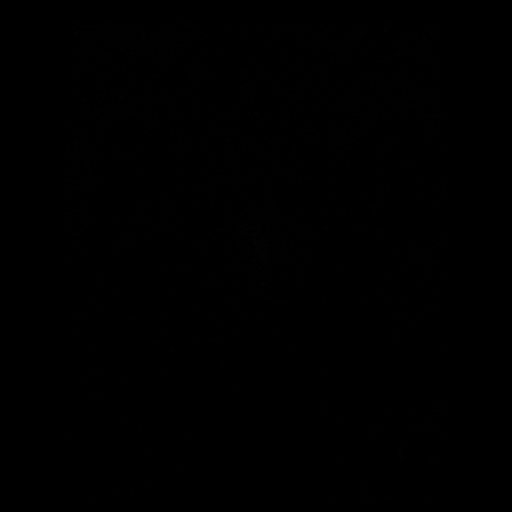

[Series 250: ADC · axial · 3.0mm · 0.94mm/px · z∈[-69,+94]mm · 4 of 56 slices shown (1 of 2)]
[im 1/56]
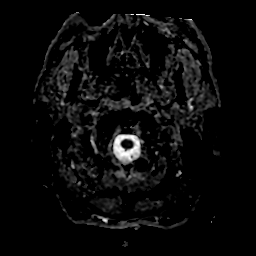
[im 19/56]
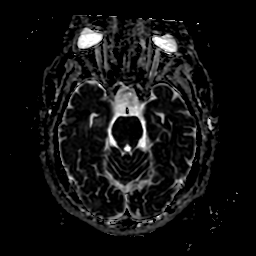
[im 37/56]
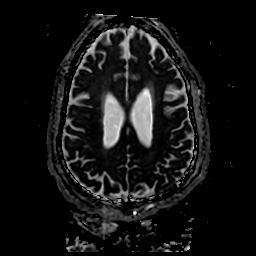
[im 56/56]
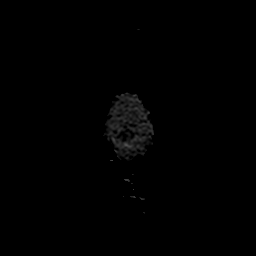

[Series 350: ADC · coronal · 4.0mm · 0.94mm/px · 3 of 40 slices shown (2 of 2)]
[im 1/40]
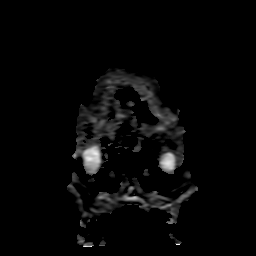
[im 20/40]
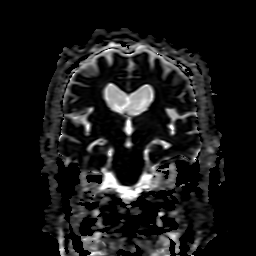
[im 40/40]
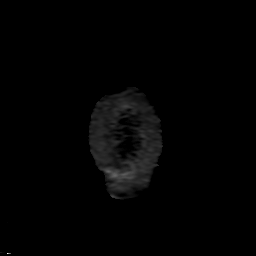

[25 of 48 positions shown; findings below may reference images not displayed]

FINDINGS: Brain: There is no evidence of an acute infarct, intracranial
hemorrhage, mass, midline shift, or extra-axial fluid collection. T2
hyperintensities in the cerebral white matter bilaterally are
nonspecific but compatible with mild chronic small vessel ischemic
disease. No focal cerebellar insult is evident. There is moderate
generalized cerebral atrophy.

Vascular: Major intracranial vascular flow voids are preserved.

Skull and upper cervical spine: Unremarkable bone marrow signal.

Sinuses/Orbits: Bilateral cataract extraction. Paranasal sinuses and
mastoid air cells are clear.

Other: None.
IMPRESSION: 1. No acute intracranial abnormality.
2. Mild chronic small vessel ischemic disease and moderate cerebral
atrophy.

## 2021-01-30 MED ORDER — SODIUM CHLORIDE 0.9 % IV BOLUS
500.0000 mL | Freq: Once | INTRAVENOUS | Status: AC
  Administered 2021-01-30: 500 mL via INTRAVENOUS

## 2021-01-30 MED ORDER — ONDANSETRON HCL 4 MG/2ML IJ SOLN
4.0000 mg | Freq: Once | INTRAMUSCULAR | Status: AC
  Administered 2021-01-30: 4 mg via INTRAVENOUS
  Filled 2021-01-30: qty 2

## 2021-01-30 MED ORDER — FENTANYL CITRATE (PF) 100 MCG/2ML IJ SOLN
50.0000 ug | Freq: Once | INTRAMUSCULAR | Status: AC
  Administered 2021-01-30: 50 ug via INTRAVENOUS
  Filled 2021-01-30: qty 2

## 2021-01-30 MED ORDER — LORAZEPAM 0.5 MG PO TABS: 0.5000 mg | ORAL_TABLET | Freq: Four times a day (QID) | ORAL | 0 refills | Status: DC | PRN

## 2021-01-30 NOTE — ED Provider Notes (Signed)
[place] EMERGENCY DEPARTMENT Provider Note   CSN: [id number] Arrival date & time: 01/30/21  7989     History No chief complaint on file.   Harold BRIDGEWATER Sr. is a 70 y.o. male.  HPI Who presents for evaluation of persistent dizziness with nausea and vomiting.  He has been seen and treated for this, and gone through rehab for suspected peripheral vertigo.  He feels like this has not helped.  Symptoms are ongoing for several months.  In the last week he has not been able to tolerate much food because of persistent nausea and vomiting.  He is not currently taking any medications.  He states he has intermittent periods of diaphoresis, and now is having abdominal pain after multiple episodes of vomiting in the last 24 hours.  He has not seen any blood in the emesis.  He denies fever or chills.  He is not having chest pain or shortness of breath.  There are no other known modifying factors.  Past Medical History:  Diagnosis Date  . Back pain   . Cataract   . Diabetes mellitus without complication ([place])   . Hypertension     Patient Active Problem List   Diagnosis Date Noted  . Diabetes mellitus, type II, insulin dependent ([place]) 07/14/2018  . Diabetic hyperosmolar non-ketotic state ([place]) 07/22/2017  . Hypertension 07/20/2017  . Infected blister 05/19/2017  . Sensorineural hearing loss (SNHL), bilateral 11/07/2016  . Tinnitus, bilateral 11/07/2016    Past Surgical History:  Procedure Laterality Date  . COLONOSCOPY  09/13/2014  . COLONOSCOPY  01/12/2020  . EYE SURGERY    . KNEE SURGERY     left knee  . MINOR CARPAL TUNNEL    . POLYPECTOMY    . TONSILLECTOMY         Family History  Problem Relation Age of Onset  . Dementia Mother   . Colon cancer Paternal Grandmother   . Colon polyps Neg Hx   . Esophageal cancer Neg Hx   . Rectal cancer Neg Hx   . Stomach cancer Neg Hx     Social History   Tobacco Use  . Smoking status: Former Smoker     Quit date: 08/31/1983    Years since quitting: 37.4  . Smokeless tobacco: Never Used  Vaping Use  . Vaping Use: Never used  Substance Use Topics  . Alcohol use: No    Alcohol/week: 0.0 standard drinks  . Drug use: No    Home Medications Prior to Admission medications   Medication Sig Start Date End Date Taking? Authorizing Provider  LORazepam (ATIVAN) 0.5 MG tablet Take 1 tablet (0.5 mg total) by mouth every 6 (six) hours as needed (Dizziness). 01/30/21  Yes Daleen Bo, MD  amLODipine (NORVASC) 10 MG tablet Take 10 mg by mouth daily.    [provider]  blood glucose meter kit and supplies Dispense based on patient and insurance preference. Use up to four times daily as directed. (FOR ICD-9 250.00, 250.01). 07/22/17   Domenic Polite, MD  Cholecalciferol (VITAMIN D3) 3000 UNITS TABS Take 5,000 Units by mouth daily.    [provider]  cloNIDine (CATAPRES) 0.1 MG tablet Take 0.1 mg by mouth at bedtime.    [provider]  insulin glargine (LANTUS) 100 UNIT/ML injection Inject 0.25 mLs (25 Units total) into the skin daily. Patient taking differently: Inject 10 Units into the skin daily.  07/23/17   Domenic Polite, MD  metFORMIN (GLUCOPHAGE) 500 MG tablet  Take 1 tablet (500 mg total) by mouth 2 (two) times daily with a meal. 07/22/17   Domenic Polite, MD  Multiple Vitamins-Minerals (MENS MULTIVITAMIN PLUS PO) Take by mouth daily.    [provider]  Omega-3 Fatty Acids (OMEGA 3 PO) Take by mouth daily.    [provider]  valsartan-hydrochlorothiazide (DIOVAN-HCT) 320-25 MG tablet Take 1 tablet by mouth daily.    [provider]    Allergies    Patient has no known allergies.  Review of Systems   Review of Systems  All other systems reviewed and are negative.   Physical Exam Updated Vital Signs BP (!) 182/83   Pulse (!) 42   Temp 97.8 F (36.6 C) (Oral)   Resp 14   SpO2 98%   Physical Exam Vitals and nursing note  reviewed.  Constitutional:      General: He is in acute distress (He is uncomfortable).     Appearance: He is well-developed. He is not ill-appearing, toxic-appearing or diaphoretic.  HENT:     Head: Normocephalic and atraumatic.     Right Ear: External ear normal.     Left Ear: External ear normal.     Mouth/Throat:     Pharynx: No oropharyngeal exudate or posterior oropharyngeal erythema.  Eyes:     Conjunctiva/sclera: Conjunctivae normal.     Pupils: Pupils are equal, round, and reactive to light.  Neck:     Trachea: Phonation normal.  Cardiovascular:     Rate and Rhythm: Normal rate and regular rhythm.     Heart sounds: Normal heart sounds.  Pulmonary:     Effort: Pulmonary effort is normal.     Breath sounds: Normal breath sounds.  Abdominal:     General: There is no distension.     Palpations: Abdomen is soft. There is no mass.     Tenderness: There is no abdominal tenderness.     Hernia: No hernia is present.     Comments: His hospital gown saturated on the chest with fluid that appears to be emesis  Musculoskeletal:        General: Normal range of motion.     Cervical back: Normal range of motion and neck supple.  Skin:    General: Skin is warm and dry.  Neurological:     Mental Status: He is alert and oriented to person, place, and time.     Cranial Nerves: No cranial nerve deficit.     Sensory: No sensory deficit.     Motor: No abnormal muscle tone.     Coordination: Coordination normal.     Comments: No dysarthria, aphasia or nystagmus.  Negative head impulse stink  Psychiatric:        Mood and Affect: Mood normal.        Behavior: Behavior normal.        Thought Content: Thought content normal.        Judgment: Judgment normal.     ED Results / Procedures / Treatments   Labs (all labs ordered are listed, but only abnormal results are displayed) Labs Reviewed  COMPREHENSIVE METABOLIC PANEL - Abnormal; Notable for the following components:      Result  Value   Glucose, Bld 157 (*)    AST 53 (*)    All other components within normal limits  CBC WITH DIFFERENTIAL/PLATELET    EKG EKG Interpretation  Date/Time:  Tuesday January 30 2021 10:08:11 EDT Ventricular Rate:  46 PR Interval:  176 QRS Duration:  94 QT Interval:  438 QTC Calculation: 383 R Axis:   -25 Text Interpretation: Sinus bradycardia Minimal voltage criteria for LVH, may be normal variant ( R in aVL ) Nonspecific T wave abnormality Abnormal ECG Since last tracing Inferior T wave inversion is new Otherwise no significant change Confirmed by Daleen Bo [phone]) on 01/30/2021 1:19:39 PM   Radiology MR BRAIN WO CONTRAST  Result Date: 01/30/2021 CLINICAL DATA:  Dizziness. EXAM: MRI HEAD WITHOUT CONTRAST TECHNIQUE: Multiplanar, multiecho pulse sequences of the brain and surrounding structures were obtained without intravenous contrast. COMPARISON:  Head CT 03/10/2009 FINDINGS: Brain: There is no evidence of an acute infarct, intracranial hemorrhage, mass, midline shift, or extra-axial fluid collection. T2 hyperintensities in the cerebral white matter bilaterally are nonspecific but compatible with mild chronic small vessel ischemic disease. No focal cerebellar insult is evident. There is moderate generalized cerebral atrophy. Vascular: Major intracranial vascular flow voids are preserved. Skull and upper cervical spine: Unremarkable bone marrow signal. Sinuses/Orbits: Bilateral cataract extraction. Paranasal sinuses and mastoid air cells are clear. Other: None. IMPRESSION: 1. No acute intracranial abnormality. 2. Mild chronic small vessel ischemic disease and moderate cerebral atrophy. Electronically Signed   By: Logan Bores M.D.   On: 01/30/2021 15:46    Procedures Procedures   Medications Ordered in ED Medications  ondansetron (ZOFRAN) injection 4 mg (4 mg Intravenous Given 01/30/21 1205)  sodium chloride 0.9 % bolus 500 mL (0 mLs Intravenous Stopped 01/30/21 1317)  fentaNYL  (SUBLIMAZE) injection 50 mcg (50 mcg Intravenous Given 01/30/21 1328)    ED Course  I have reviewed the triage vital signs and the nursing notes.  Pertinent labs & imaging results that were available during my care of the patient were reviewed by me and considered in my medical decision making (see chart for details).    MDM Rules/Calculators/A&P                           Patient Vitals for the past 24 hrs:  BP Temp Temp src Pulse Resp SpO2  01/30/21 1445 (!) 182/83 -- -- (!) 42 14 98 %  01/30/21 1430 (!) 176/84 -- -- (!) 43 17 96 %  01/30/21 1400 (!) 166/82 -- -- (!) 41 17 98 %  01/30/21 1345 (!) 172/73 -- -- (!) 42 20 95 %  01/30/21 1330 (!) 181/69 -- -- (!) 43 16 99 %  01/30/21 1315 (!) 167/78 -- -- (!) 47 15 99 %  01/30/21 1300 (!) 145/104 -- -- (!) 44 16 99 %  01/30/21 1230 (!) 180/77 -- -- (!) 45 15 97 %  01/30/21 1215 (!) 158/81 -- -- (!) 43 17 99 %  01/30/21 1200 (!) 173/77 -- -- (!) 47 11 99 %  01/30/21 1140 (!) 165/66 -- -- (!) 43 15 100 %  01/30/21 1006 (!) 169/73 97.8 F (36.6 C) Oral (!) 47 18 100 %    4:09 PM Reevaluation with update and discussion. After initial assessment and treatment, an updated evaluation reveals he is not currently vomiting.  He has no further complaints.  Findings discussed with the patient all questions were answered. Daleen Bo   Medical Decision Making:  This patient is presenting for evaluation of dizziness with vomiting, which does require a range of treatment options, and is a complaint that involves a moderate risk of morbidity and mortality. The differential diagnoses include gastroenteritis, benign positional vertigo, metabolic disorder. I decided to review old records, and in  summary elderly male, currently being treated for vertigo with physical therapy, without improvement ongoing symptoms for several months.  I did not require additional historical information from anyone.  Clinical Laboratory Tests Ordered, included CBC and  Metabolic panel. Review indicates findings. Radiologic Tests Ordered, included MRI brain.  I independently Visualized: Radiographic images, which show no CVA or mass    Critical Interventions-clinical evaluation, IV fluids, IV medications, MRI imaging, observation and reassessment  After These Interventions, the Patient was reevaluated and was found stable for discharge.  Patient with nonspecific ongoing symptoms without signs of cardiopulmonary compromise, metabolic disorder or CVA.  Stable for discharge with outpatient management.  Will give trial of Ativan to use for symptoms of vertigo, and vomiting  CRITICAL CARE-no Performed by: Daleen Bo  Nursing Notes Reviewed/ Care Coordinated Applicable Imaging Reviewed Interpretation of Laboratory Data incorporated into ED treatment  The patient appears reasonably screened and/or stabilized for discharge and I doubt any other medical condition or other [facility] requiring further screening, evaluation, or treatment in the ED at this time prior to discharge.  Plan: Home Medications-continue current medication; Home Treatments-gradually advance diet; return here if the recommended treatment, does not improve the symptoms; Recommended follow up-follow-up with PCP, or ENT for further care and treatment.        Final Clinical Impression(s) / ED Diagnoses Final diagnoses:  Dizziness    Rx / DC Orders ED Discharge Orders         Ordered    LORazepam (ATIVAN) 0.5 MG tablet  Every 6 hours PRN        01/30/21 1608           Daleen Bo, MD 01/30/21 2035

## 2021-01-30 NOTE — ED Triage Notes (Signed)
Pt here with c/o vertigo type symptoms that started this morning , pt has hx of same , heart rate is 46

## 2021-01-30 NOTE — ED Notes (Signed)
Please call sister when POC has been decided. She will be his ride home if pt is d/c'd. Number is in chart.

## 2021-01-30 NOTE — ED Notes (Signed)
Patient transported to MRI 

## 2021-01-30 NOTE — Discharge Instructions (Addendum)
Stay on a clear liquid diet, until your vomiting improves.  Use the prescription medicine, sent to your pharmacy today, to help your dizziness.  Follow-up with your primary care doctor for further care and treatment.  Consider seeing Dr. Redmond Baseman, your ENT doctor or follow-up with the ENT doctors at [facility] for further care and treatment of your dizziness

## 2021-01-30 NOTE — ED Notes (Signed)
Pt ripped out IV and refused vital signs

## 2021-01-30 NOTE — ED Notes (Signed)
Pt threw up 10 minutes after drinking water.

## 2021-02-01 DIAGNOSIS — M5416 Radiculopathy, lumbar region: Secondary | ICD-10-CM | POA: Diagnosis not present

## 2021-02-03 DIAGNOSIS — H8309 Labyrinthitis, unspecified ear: Secondary | ICD-10-CM | POA: Diagnosis not present

## 2021-02-03 DIAGNOSIS — M5431 Sciatica, right side: Secondary | ICD-10-CM | POA: Diagnosis not present

## 2021-02-03 DIAGNOSIS — H9313 Tinnitus, bilateral: Secondary | ICD-10-CM | POA: Diagnosis not present

## 2021-02-03 DIAGNOSIS — E1169 Type 2 diabetes mellitus with other specified complication: Secondary | ICD-10-CM | POA: Diagnosis not present

## 2021-02-03 DIAGNOSIS — R112 Nausea with vomiting, unspecified: Secondary | ICD-10-CM | POA: Diagnosis not present

## 2021-02-03 DIAGNOSIS — I10 Essential (primary) hypertension: Secondary | ICD-10-CM | POA: Diagnosis not present

## 2021-02-08 DIAGNOSIS — H8110 Benign paroxysmal vertigo, unspecified ear: Secondary | ICD-10-CM | POA: Diagnosis not present

## 2021-02-12 DIAGNOSIS — M5416 Radiculopathy, lumbar region: Secondary | ICD-10-CM | POA: Diagnosis not present

## 2021-02-12 DIAGNOSIS — Z131 Encounter for screening for diabetes mellitus: Secondary | ICD-10-CM | POA: Diagnosis not present

## 2021-02-15 DIAGNOSIS — M542 Cervicalgia: Secondary | ICD-10-CM | POA: Diagnosis not present

## 2021-02-15 DIAGNOSIS — M5416 Radiculopathy, lumbar region: Secondary | ICD-10-CM | POA: Diagnosis not present

## 2021-02-15 DIAGNOSIS — M47892 Other spondylosis, cervical region: Secondary | ICD-10-CM | POA: Diagnosis not present

## 2021-02-15 DIAGNOSIS — M47896 Other spondylosis, lumbar region: Secondary | ICD-10-CM | POA: Diagnosis not present

## 2021-02-15 DIAGNOSIS — M545 Low back pain, unspecified: Secondary | ICD-10-CM | POA: Diagnosis not present

## 2021-02-16 DIAGNOSIS — E1169 Type 2 diabetes mellitus with other specified complication: Secondary | ICD-10-CM | POA: Diagnosis not present

## 2021-02-16 DIAGNOSIS — Z794 Long term (current) use of insulin: Secondary | ICD-10-CM | POA: Diagnosis not present

## 2021-02-16 DIAGNOSIS — M130 Polyarthritis, unspecified: Secondary | ICD-10-CM | POA: Diagnosis not present

## 2021-02-16 DIAGNOSIS — M545 Low back pain, unspecified: Secondary | ICD-10-CM | POA: Diagnosis not present

## 2021-02-16 DIAGNOSIS — I10 Essential (primary) hypertension: Secondary | ICD-10-CM | POA: Diagnosis not present

## 2021-02-27 DIAGNOSIS — M5416 Radiculopathy, lumbar region: Secondary | ICD-10-CM | POA: Diagnosis not present

## 2021-03-01 ENCOUNTER — Other Ambulatory Visit: Payer: Self-pay | Admitting: Orthopaedic Surgery

## 2021-03-01 DIAGNOSIS — M47892 Other spondylosis, cervical region: Secondary | ICD-10-CM

## 2021-03-15 ENCOUNTER — Ambulatory Visit
Admission: RE | Admit: 2021-03-15 | Discharge: 2021-03-15 | Disposition: A | Discharge status: Home / Self Care | Payer: Medicare Other | Source: Ambulatory Visit | Attending: Orthopaedic Surgery | Admitting: Orthopaedic Surgery

## 2021-03-15 ENCOUNTER — Other Ambulatory Visit: Payer: Self-pay

## 2021-03-15 DIAGNOSIS — M47892 Other spondylosis, cervical region: Secondary | ICD-10-CM

## 2021-03-15 DIAGNOSIS — M4802 Spinal stenosis, cervical region: Secondary | ICD-10-CM | POA: Diagnosis not present

## 2021-03-15 IMAGING — MR MR CERVICAL SPINE W/O CM
4 of 5 series · 26 of 48 positions shown · non-contrast
Comparison: CT of the cervical spine [DATE].

CLINICAL DATA: Other spondylosis, cervical region. Additional
history provided by scanning technologist: Patient reports
dizziness, nausea, neck pain with left arm numbness for 1 year.

EXAM:
MRI CERVICAL SPINE WITHOUT CONTRAST
TECHNIQUE: Multiplanar, multisequence MR imaging of the cervical spine was
performed. No intravenous contrast was administered.

[Series 5: T2 · sagittal · 3.0mm · 0.55mm/px · 6 of 15 slices shown (1 of 2)]
[im 1/15]
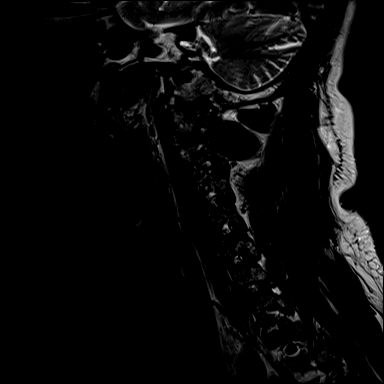
[im 3/15]
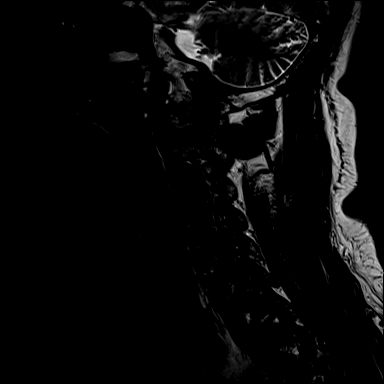
[im 6/15]
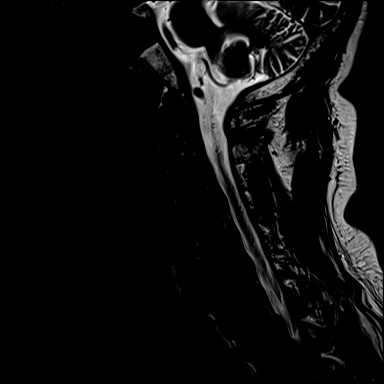
[im 9/15]
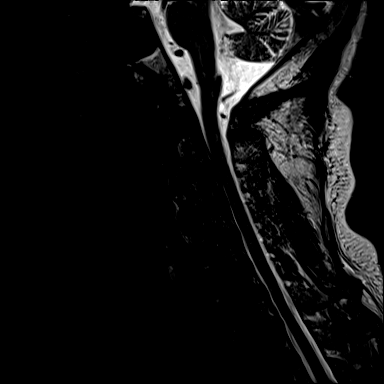
[im 12/15]
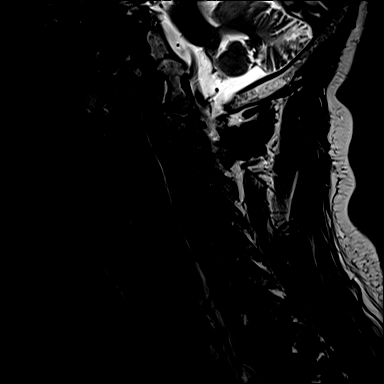
[im 15/15]
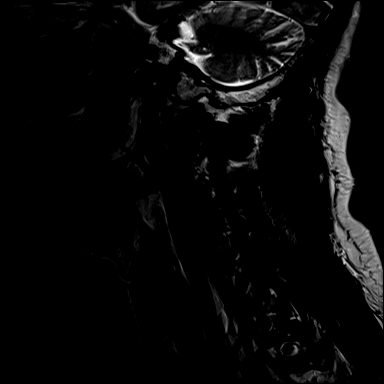

[Series 6: T1 · sagittal · 3.0mm · 0.66mm/px · 6 of 15 slices shown]
[im 1/15]
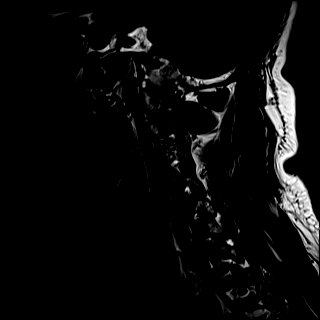
[im 3/15]
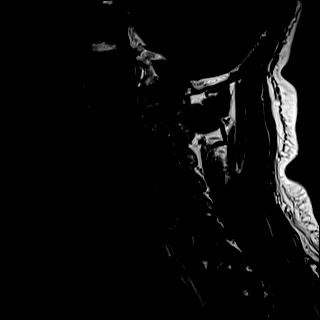
[im 6/15]
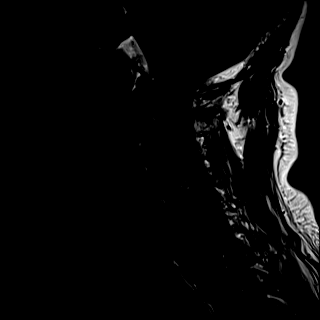
[im 9/15]
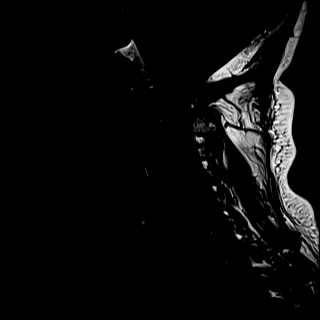
[im 12/15]
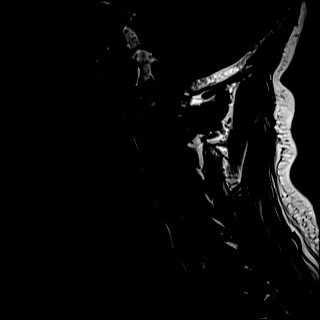
[im 15/15]
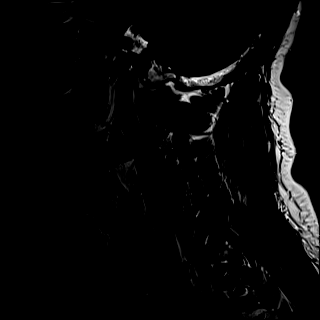

[Series 7: STIR · sagittal · 3.0mm · 0.33mm/px · 5 of 15 slices shown]
[im 1/15]
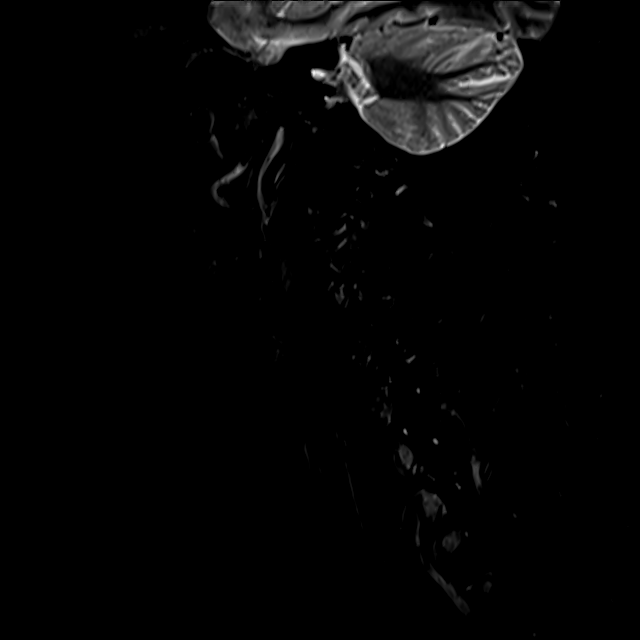
[im 3/15]
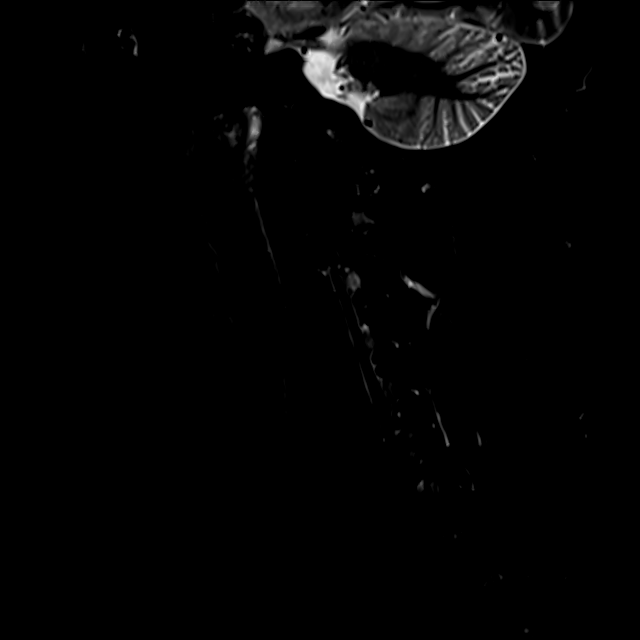
[im 6/15]
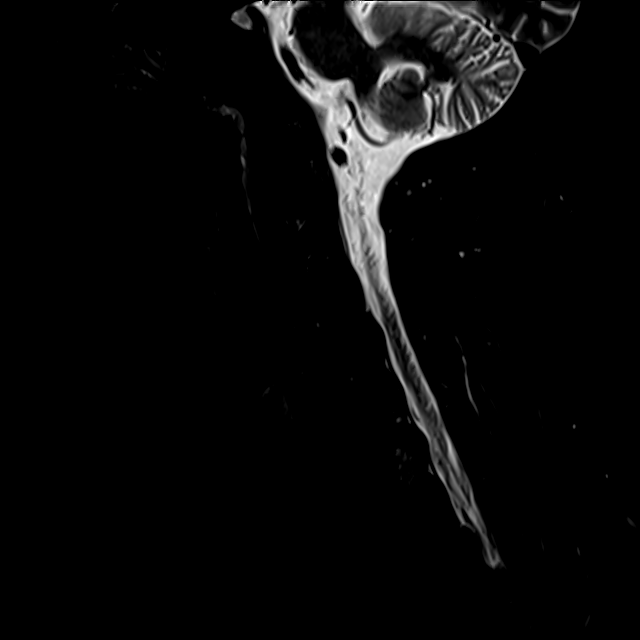
[im 9/15]
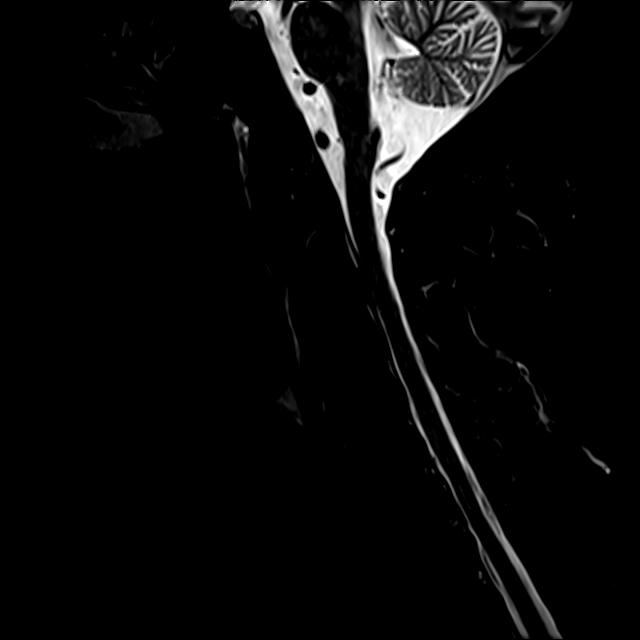
[im 15/15]
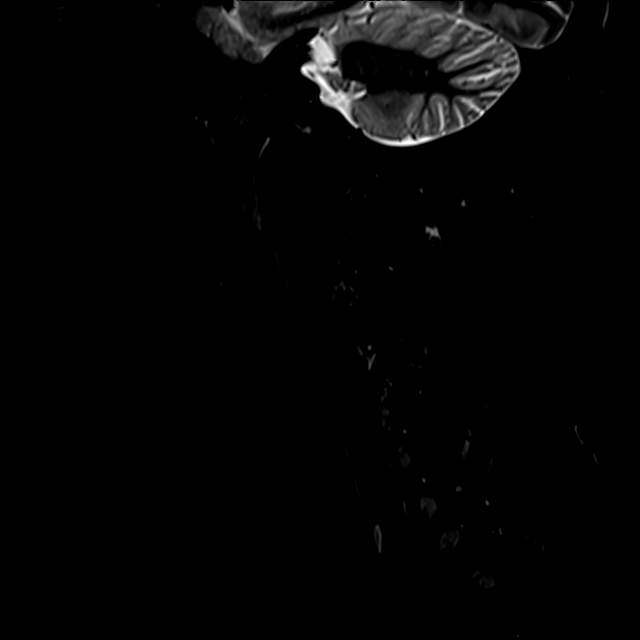

[Series 8: T2 · axial · 3.0mm · 0.50mm/px · z∈[-79,+32]mm · 9 of 37 slices shown (2 of 2)]
[im 1/37]
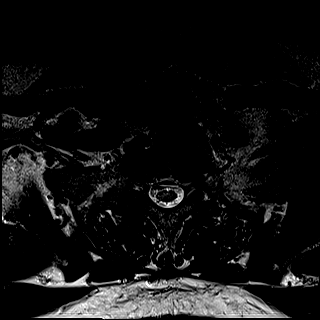
[im 6/37]
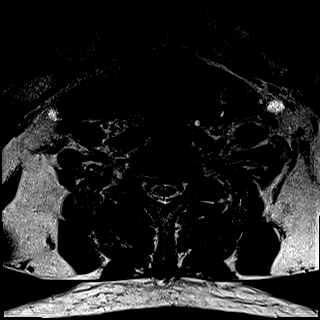
[im 11/37]
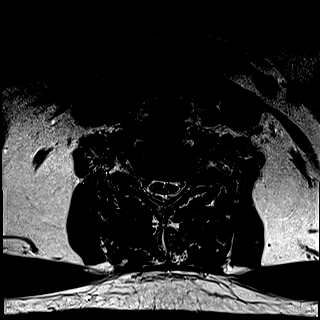
[im 16/37]
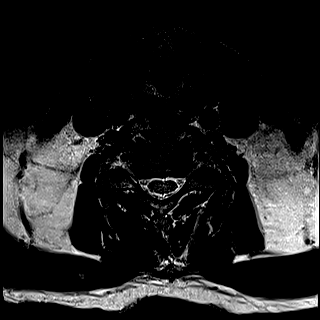
[im 19/37]
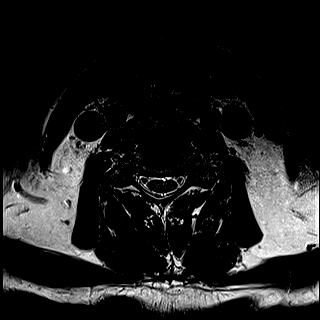
[im 21/37]
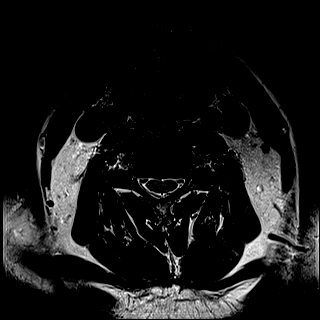
[im 26/37]
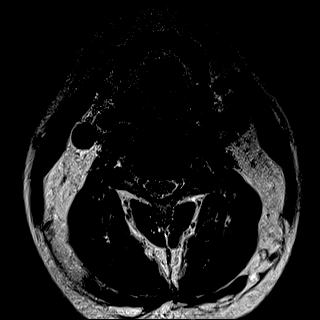
[im 31/37]
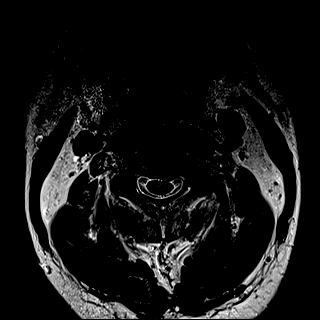
[im 37/37]
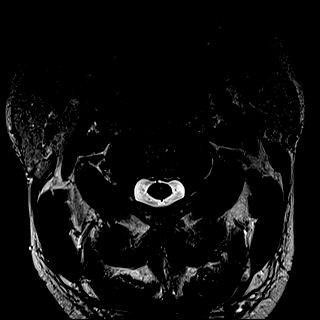

[26 of 48 positions shown; findings below may reference images not displayed]

FINDINGS: Mild intermittent motion degradation.

Alignment: Straightening of the expected cervical lordosis. No
significant spondylolisthesis.

Vertebrae: Vertebral body height is maintained. 5 mm T2/STIR
hyperintense lesion within the C6 vertebral body, abutting the
endplate. This is nonspecific but may reflect a Schmorl node.
Elsewhere, no significant marrow edema or focal suspicious osseous
lesion is identified. Multilevel ventral osteophytes.

Cord: No spinal cord signal abnormality is identified.

Posterior Fossa, vertebral arteries, paraspinal tissues: Cerebellar
atrophy. No acute abnormality identified within included portions of
the posterior fossa. Flow voids preserved within the imaged cervical
vertebral arteries. Paraspinal soft tissues within normal limits.

Disc levels:

Mild disc degeneration throughout the cervical and visualized upper
thoracic spine.

C2-C3: Shallow disc bulge. Left greater than right uncinate
hypertrophy. Mild facet arthrosis (predominantly on the left). No
significant spinal canal stenosis. Moderate left neural foraminal
narrowing.

C3-C4: Disc bulge. Bilateral disc osteophyte ridge/uncinate
hypertrophy. Mild facet arthrosis. Minimal partial effacement of the
ventral thecal sac without spinal cord mass effect. Bilateral neural
foraminal narrowing (moderate right, moderate/severe left).

C4-C5: Disc bulge. Left greater than right disc osteophyte
ridge/uncinate hypertrophy. Facet arthrosis. Mild relative spinal
canal narrowing without spinal cord mass effect. Moderate/severe
bilateral neural foraminal narrowing (greater on the left).

C5-C6: Disc bulge. Right greater than left disc osteophyte
ridge/uncinate hypertrophy. Facet arthrosis. Minimal partial
effacement of the ventral thecal sac without spinal cord mass
effect. Bilateral neural foraminal narrowing (moderate/severe right,
moderate left).

C6-C7: Disc bulge with superimposed small central disc protrusion.
Left greater than right disc osteophyte ridge/uncinate hypertrophy.
Minimal facet arthrosis. Mild relative spinal canal narrowing
without spinal cord mass effect. Bilateral neural foraminal
narrowing (mild right, moderate/severe left).

C7-T1: Disc bulge with right-sided disc osteophyte ridge. Facet
arthrosis (greater on the left). Mild relative spinal canal
narrowing without spinal cord mass effect. Bilateral neural
foraminal narrowing (mild right, moderate left).
IMPRESSION: Cervical spondylosis, as outlined.

No more than mild spinal canal narrowing.

Multilevel neural foraminal narrowing, greatest on the left at C3-C4
(moderate/severe), bilaterally at C4-C5 (moderate/severe), on the
right at C5-C6 (moderate/severe), and on the left at C6-C7
(moderate/severe). Additional sites of mild and moderate foraminal
stenosis, as described above.

## 2021-03-19 DIAGNOSIS — M5416 Radiculopathy, lumbar region: Secondary | ICD-10-CM | POA: Diagnosis not present

## 2021-03-22 DIAGNOSIS — M47896 Other spondylosis, lumbar region: Secondary | ICD-10-CM | POA: Diagnosis not present

## 2021-03-22 DIAGNOSIS — M47892 Other spondylosis, cervical region: Secondary | ICD-10-CM | POA: Diagnosis not present

## 2021-03-22 DIAGNOSIS — M5412 Radiculopathy, cervical region: Secondary | ICD-10-CM | POA: Diagnosis not present

## 2021-03-22 DIAGNOSIS — M5416 Radiculopathy, lumbar region: Secondary | ICD-10-CM | POA: Diagnosis not present

## 2021-04-10 DIAGNOSIS — U071 COVID-19: Secondary | ICD-10-CM | POA: Diagnosis not present

## 2021-04-10 DIAGNOSIS — Z0001 Encounter for general adult medical examination with abnormal findings: Secondary | ICD-10-CM | POA: Diagnosis not present

## 2021-04-12 DIAGNOSIS — E1165 Type 2 diabetes mellitus with hyperglycemia: Secondary | ICD-10-CM | POA: Diagnosis not present

## 2021-04-12 DIAGNOSIS — I270 Primary pulmonary hypertension: Secondary | ICD-10-CM | POA: Diagnosis not present

## 2021-04-12 DIAGNOSIS — Z794 Long term (current) use of insulin: Secondary | ICD-10-CM | POA: Diagnosis not present

## 2021-05-13 DIAGNOSIS — Z794 Long term (current) use of insulin: Secondary | ICD-10-CM | POA: Diagnosis not present

## 2021-05-13 DIAGNOSIS — I270 Primary pulmonary hypertension: Secondary | ICD-10-CM | POA: Diagnosis not present

## 2021-05-13 DIAGNOSIS — E1165 Type 2 diabetes mellitus with hyperglycemia: Secondary | ICD-10-CM | POA: Diagnosis not present

## 2021-06-13 DIAGNOSIS — E1165 Type 2 diabetes mellitus with hyperglycemia: Secondary | ICD-10-CM | POA: Diagnosis not present

## 2021-06-13 DIAGNOSIS — Z794 Long term (current) use of insulin: Secondary | ICD-10-CM | POA: Diagnosis not present

## 2021-06-13 DIAGNOSIS — I270 Primary pulmonary hypertension: Secondary | ICD-10-CM | POA: Diagnosis not present

## 2021-07-13 DIAGNOSIS — I270 Primary pulmonary hypertension: Secondary | ICD-10-CM | POA: Diagnosis not present

## 2021-07-13 DIAGNOSIS — Z794 Long term (current) use of insulin: Secondary | ICD-10-CM | POA: Diagnosis not present

## 2021-07-13 DIAGNOSIS — E1165 Type 2 diabetes mellitus with hyperglycemia: Secondary | ICD-10-CM | POA: Diagnosis not present

## 2021-08-02 DIAGNOSIS — I10 Essential (primary) hypertension: Secondary | ICD-10-CM | POA: Diagnosis not present

## 2021-08-02 DIAGNOSIS — E1169 Type 2 diabetes mellitus with other specified complication: Secondary | ICD-10-CM | POA: Diagnosis not present

## 2021-08-02 DIAGNOSIS — Z23 Encounter for immunization: Secondary | ICD-10-CM | POA: Diagnosis not present

## 2021-08-13 DIAGNOSIS — Z794 Long term (current) use of insulin: Secondary | ICD-10-CM | POA: Diagnosis not present

## 2021-08-13 DIAGNOSIS — I270 Primary pulmonary hypertension: Secondary | ICD-10-CM | POA: Diagnosis not present

## 2021-08-13 DIAGNOSIS — E1165 Type 2 diabetes mellitus with hyperglycemia: Secondary | ICD-10-CM | POA: Diagnosis not present

## 2021-09-12 DIAGNOSIS — E1165 Type 2 diabetes mellitus with hyperglycemia: Secondary | ICD-10-CM | POA: Diagnosis not present

## 2021-09-12 DIAGNOSIS — I270 Primary pulmonary hypertension: Secondary | ICD-10-CM | POA: Diagnosis not present

## 2021-09-12 DIAGNOSIS — Z794 Long term (current) use of insulin: Secondary | ICD-10-CM | POA: Diagnosis not present

## 2021-11-12 DIAGNOSIS — B349 Viral infection, unspecified: Secondary | ICD-10-CM | POA: Diagnosis not present

## 2021-11-12 DIAGNOSIS — R42 Dizziness and giddiness: Secondary | ICD-10-CM | POA: Diagnosis not present

## 2021-12-03 DIAGNOSIS — Z794 Long term (current) use of insulin: Secondary | ICD-10-CM | POA: Diagnosis not present

## 2021-12-03 DIAGNOSIS — H8110 Benign paroxysmal vertigo, unspecified ear: Secondary | ICD-10-CM | POA: Diagnosis not present

## 2021-12-03 DIAGNOSIS — I10 Essential (primary) hypertension: Secondary | ICD-10-CM | POA: Diagnosis not present

## 2021-12-03 DIAGNOSIS — M130 Polyarthritis, unspecified: Secondary | ICD-10-CM | POA: Diagnosis not present

## 2021-12-03 DIAGNOSIS — E782 Mixed hyperlipidemia: Secondary | ICD-10-CM | POA: Diagnosis not present

## 2021-12-03 DIAGNOSIS — E1169 Type 2 diabetes mellitus with other specified complication: Secondary | ICD-10-CM | POA: Diagnosis not present

## 2022-04-03 DIAGNOSIS — Z794 Long term (current) use of insulin: Secondary | ICD-10-CM | POA: Diagnosis not present

## 2022-04-03 DIAGNOSIS — Z0000 Encounter for general adult medical examination without abnormal findings: Secondary | ICD-10-CM | POA: Diagnosis not present

## 2022-04-03 DIAGNOSIS — H8309 Labyrinthitis, unspecified ear: Secondary | ICD-10-CM | POA: Diagnosis not present

## 2022-04-03 DIAGNOSIS — E1169 Type 2 diabetes mellitus with other specified complication: Secondary | ICD-10-CM | POA: Diagnosis not present

## 2022-04-03 DIAGNOSIS — I10 Essential (primary) hypertension: Secondary | ICD-10-CM | POA: Diagnosis not present

## 2022-04-03 DIAGNOSIS — E782 Mixed hyperlipidemia: Secondary | ICD-10-CM | POA: Diagnosis not present

## 2022-04-30 ENCOUNTER — Ambulatory Visit (INDEPENDENT_AMBULATORY_CARE_PROVIDER_SITE_OTHER): Payer: Self-pay | Admitting: Podiatry

## 2022-04-30 DIAGNOSIS — Z91199 Patient's noncompliance with other medical treatment and regimen due to unspecified reason: Secondary | ICD-10-CM

## 2022-04-30 NOTE — Progress Notes (Signed)
No show

## 2022-07-21 ENCOUNTER — Encounter (HOSPITAL_COMMUNITY): Payer: Self-pay

## 2022-07-21 ENCOUNTER — Emergency Department (HOSPITAL_BASED_OUTPATIENT_CLINIC_OR_DEPARTMENT_OTHER): Payer: Medicare Other

## 2022-07-21 ENCOUNTER — Other Ambulatory Visit: Payer: Self-pay

## 2022-07-21 ENCOUNTER — Emergency Department (HOSPITAL_BASED_OUTPATIENT_CLINIC_OR_DEPARTMENT_OTHER)
Admission: EM | Admit: 2022-07-21 | Discharge: 2022-07-22 | Disposition: A | Discharge status: Home / Self Care | Payer: Medicare Other | Attending: Student | Admitting: Student

## 2022-07-21 DIAGNOSIS — Z79899 Other long term (current) drug therapy: Secondary | ICD-10-CM | POA: Diagnosis not present

## 2022-07-21 DIAGNOSIS — Z794 Long term (current) use of insulin: Secondary | ICD-10-CM | POA: Diagnosis not present

## 2022-07-21 DIAGNOSIS — R112 Nausea with vomiting, unspecified: Secondary | ICD-10-CM

## 2022-07-21 DIAGNOSIS — R1084 Generalized abdominal pain: Secondary | ICD-10-CM | POA: Diagnosis not present

## 2022-07-21 DIAGNOSIS — R001 Bradycardia, unspecified: Secondary | ICD-10-CM | POA: Diagnosis not present

## 2022-07-21 DIAGNOSIS — R6883 Chills (without fever): Secondary | ICD-10-CM | POA: Insufficient documentation

## 2022-07-21 DIAGNOSIS — E119 Type 2 diabetes mellitus without complications: Secondary | ICD-10-CM | POA: Diagnosis not present

## 2022-07-21 DIAGNOSIS — Z1152 Encounter for screening for COVID-19: Secondary | ICD-10-CM | POA: Diagnosis not present

## 2022-07-21 DIAGNOSIS — R197 Diarrhea, unspecified: Secondary | ICD-10-CM | POA: Insufficient documentation

## 2022-07-21 DIAGNOSIS — I10 Essential (primary) hypertension: Secondary | ICD-10-CM | POA: Insufficient documentation

## 2022-07-21 DIAGNOSIS — Z7984 Long term (current) use of oral hypoglycemic drugs: Secondary | ICD-10-CM | POA: Insufficient documentation

## 2022-07-21 DIAGNOSIS — R109 Unspecified abdominal pain: Secondary | ICD-10-CM | POA: Diagnosis not present

## 2022-07-21 LAB — CBC
HCT: 46.6 % (ref 39.0–52.0)
Hemoglobin: 16.2 g/dL (ref 13.0–17.0)
MCH: 33.2 pg (ref 26.0–34.0)
MCHC: 34.8 g/dL (ref 30.0–36.0)
MCV: 95.5 fL (ref 80.0–100.0)
Platelets: 228 10*3/uL (ref 150–400)
RBC: 4.88 MIL/uL (ref 4.22–5.81)
RDW: 11.9 % (ref 11.5–15.5)
WBC: 9.8 10*3/uL (ref 4.0–10.5)
nRBC: 0 % (ref 0.0–0.2)

## 2022-07-21 LAB — URINALYSIS, ROUTINE W REFLEX MICROSCOPIC
Bilirubin Urine: NEGATIVE
Glucose, UA: NEGATIVE mg/dL
Hgb urine dipstick: NEGATIVE
Ketones, ur: 15 mg/dL — AB
Leukocytes,Ua: NEGATIVE
Nitrite: NEGATIVE
Protein, ur: 30 mg/dL — AB
Specific Gravity, Urine: 1.021 (ref 1.005–1.030)
pH: 6.5 (ref 5.0–8.0)

## 2022-07-21 LAB — COMPREHENSIVE METABOLIC PANEL
ALT: 31 U/L (ref 0–44)
AST: 23 U/L (ref 15–41)
Albumin: 4.4 g/dL (ref 3.5–5.0)
Alkaline Phosphatase: 55 U/L (ref 38–126)
Anion gap: 15 (ref 5–15)
BUN: 9 mg/dL (ref 8–23)
CO2: 22 mmol/L (ref 22–32)
Calcium: 10.5 mg/dL — ABNORMAL HIGH (ref 8.9–10.3)
Chloride: 103 mmol/L (ref 98–111)
Creatinine, Ser: 0.86 mg/dL (ref 0.61–1.24)
GFR, Estimated: >60 mL/min (ref >60)
Glucose, Bld: 148 mg/dL — ABNORMAL HIGH (ref 70–99)
Potassium: 3 mmol/L — ABNORMAL LOW (ref 3.5–5.1)
Sodium: 140 mmol/L (ref 135–145)
Total Bilirubin: 0.9 mg/dL (ref 0.3–1.2)
Total Protein: 8.4 g/dL — ABNORMAL HIGH (ref 6.5–8.1)

## 2022-07-21 LAB — RESP PANEL BY RT-PCR (FLU A&B, COVID) ARPGX2
Influenza A by PCR: NEGATIVE
Influenza B by PCR: NEGATIVE
SARS Coronavirus 2 by RT PCR: NEGATIVE

## 2022-07-21 LAB — LIPASE, BLOOD: Lipase: 124 U/L — ABNORMAL HIGH (ref 11–51)

## 2022-07-21 MED ORDER — MORPHINE SULFATE (PF) 4 MG/ML IV SOLN
4.0000 mg | Freq: Once | INTRAVENOUS | Status: AC
  Administered 2022-07-21: 4 mg via INTRAVENOUS
  Filled 2022-07-21: qty 1

## 2022-07-21 MED ORDER — METOCLOPRAMIDE HCL 10 MG PO TABS: 10.0000 mg | ORAL_TABLET | Freq: Four times a day (QID) | ORAL | 0 refills | Status: DC

## 2022-07-21 MED ORDER — SODIUM CHLORIDE 0.9 % IV BOLUS
1000.0000 mL | Freq: Once | INTRAVENOUS | Status: AC
  Administered 2022-07-21: 1000 mL via INTRAVENOUS

## 2022-07-21 MED ORDER — IOHEXOL 300 MG/ML  SOLN
100.0000 mL | Freq: Once | INTRAMUSCULAR | Status: AC | PRN
  Administered 2022-07-21: 100 mL via INTRAVENOUS

## 2022-07-21 MED ORDER — LACTATED RINGERS IV SOLN: INTRAVENOUS | Status: DC

## 2022-07-21 MED ORDER — POTASSIUM CHLORIDE 10 MEQ/100ML IV SOLN
10.0000 meq | INTRAVENOUS | Status: AC
  Administered 2022-07-22 (×3): 10 meq via INTRAVENOUS
  Filled 2022-07-21 (×3): qty 100

## 2022-07-21 MED ORDER — SODIUM CHLORIDE 0.9 % IV SOLN
12.5000 mg | Freq: Four times a day (QID) | INTRAVENOUS | Status: DC | PRN
  Administered 2022-07-22: 12.5 mg via INTRAVENOUS
  Filled 2022-07-21: qty 0.5

## 2022-07-21 MED ORDER — ONDANSETRON 4 MG PO TBDP
4.0000 mg | ORAL_TABLET | Freq: Once | ORAL | Status: AC | PRN
  Administered 2022-07-21: 4 mg via ORAL
  Filled 2022-07-21: qty 1

## 2022-07-21 MED ORDER — ONDANSETRON HCL 4 MG/2ML IJ SOLN
4.0000 mg | Freq: Once | INTRAMUSCULAR | Status: AC
  Administered 2022-07-21: 4 mg via INTRAVENOUS
  Filled 2022-07-21: qty 2

## 2022-07-21 MED ORDER — METOCLOPRAMIDE HCL 5 MG/ML IJ SOLN
10.0000 mg | Freq: Once | INTRAMUSCULAR | Status: AC
  Administered 2022-07-21: 10 mg via INTRAVENOUS
  Filled 2022-07-21: qty 2

## 2022-07-21 MED ORDER — POTASSIUM CHLORIDE 10 MEQ/100ML IV SOLN
10.0000 meq | Freq: Once | INTRAVENOUS | Status: AC
  Administered 2022-07-21: 10 meq via INTRAVENOUS
  Filled 2022-07-21: qty 100

## 2022-07-21 MED ORDER — POTASSIUM CHLORIDE CRYS ER 20 MEQ PO TBCR
40.0000 meq | EXTENDED_RELEASE_TABLET | Freq: Two times a day (BID) | ORAL | Status: DC
  Administered 2022-07-21: 40 meq via ORAL
  Filled 2022-07-21: qty 2

## 2022-07-21 NOTE — Discharge Instructions (Addendum)
You have been seen today for your complaint of nausea and vomiting. Your lab work was reassuring. Your imaging was reassuring and showed no abnormalities. Your discharge medications include back pain.  This is a nausea medication.  You should take it as prescribed and as needed for nausea.  You should take it before you eat. Home care instructions are as follows:  You should continue to eat and drink as he normally would.  You should drink plenty of fluids. Follow up with: Your primary care provider in the next week Please seek immediate medical care if you develop any of the following symptoms: You have pain in your chest, neck, arm, or jaw. You feel very weak or you faint. You vomit again and again. You have vomit that is bright red or looks like black coffee grounds. You have bloody or black poop (stools) or poop that looks like tar. You have a very bad headache, a stiff neck, or both. You have very bad pain, cramping, or bloating in your belly (abdomen). You have trouble breathing. You are breathing very quickly. Your heart is beating very quickly. Your skin feels cold and clammy. You feel confused. You have signs of losing too much water in your body, such as: Dark pee, very little pee, or no pee. Cracked lips. Dry mouth. Sunken eyes. Sleepiness. Weakness. At this time there does not appear to be the presence of an emergent medical condition, however there is always the potential for conditions to change. Please read and follow the below instructions.  Do not take your medicine if  develop an itchy rash, swelling in your mouth or lips, or difficulty breathing; call 911 and seek immediate emergency medical attention if this occurs.  You may review your lab tests and imaging results in their entirety on your MyChart account.  Please discuss all results of fully with your primary care provider and other specialist at your follow-up visit.  Note: Portions of this text may have been  transcribed using voice recognition software. Every effort was made to ensure accuracy; however, inadvertent computerized transcription errors may still be present.

## 2022-07-21 NOTE — ED Notes (Signed)
Pt was originally discharged and waiting for family to transport him home. Pts family upon arrival wanted to talk to the MD and talk about him being admitted. MD agreed and Pt was undischarged and placed/moved to room 16. Report given to the RN.

## 2022-07-21 NOTE — ED Provider Notes (Signed)
[facility] DEPT Provider Note   CSN: [id number] Arrival date & time: 07/21/22  1238     History  Chief Complaint  Patient presents with   Abdominal Pain   Vomiting    Harold [name]. is a 71 y.o. male.  With history of hypertension and insulin-dependent diabetes type II who presents ED for evaluation of generalized abdominal pain, nausea and vomiting.  He states he ate oysters last night and woke up this morning with numerous episodes of vomiting as well as 4 episodes of diarrhea.  States pain is diffuse across the entire abdomen without radiation to the back or chest and describes it as a dull aching and cramping pain.  Patient has been unable to tolerate p.o. food or liquids today.  Also reports chills and cold sweats. Rates the pain at a 5/10.  Denies hematemesis, hematochezia, chest pain, shortness of breath, fevers. Also states he has been urinating more than usual today. Denies smoking, marijuana, chronic NSAID therapy.    Abdominal Pain Associated symptoms: nausea and vomiting        Home Medications Prior to Admission medications   Medication Sig Start Date End Date Taking? Authorizing Provider  metoCLOPramide (REGLAN) 10 MG tablet Take 1 tablet (10 mg total) by mouth every 6 (six) hours. 07/21/22  Yes Teaghan Melrose, Graft[place] Folk, PA-C  amLODipine (NORVASC) 10 MG tablet Take 10 mg by mouth daily.    [provider]  blood glucose meter kit and supplies Dispense based on patient and insurance preference. Use up to four times daily as directed. (FOR ICD-9 250.00, 250.01). 07/22/17   Domenic Polite, MD  Cholecalciferol (VITAMIN D3) 3000 UNITS TABS Take 5,000 Units by mouth daily.    [provider]  cloNIDine (CATAPRES) 0.1 MG tablet Take 0.1 mg by mouth at bedtime.    [provider]  insulin glargine (LANTUS) 100 UNIT/ML injection Inject 0.25 mLs (25 Units total) into the skin daily. Patient taking differently: Inject 10  Units into the skin daily.  07/23/17   Domenic Polite, MD  LORazepam (ATIVAN) 0.5 MG tablet Take 1 tablet (0.5 mg total) by mouth every 6 (six) hours as needed (Dizziness). 01/30/21   Daleen Bo, MD  metFORMIN (GLUCOPHAGE) 500 MG tablet Take 1 tablet (500 mg total) by mouth 2 (two) times daily with a meal. 07/22/17   Domenic Polite, MD  Multiple Vitamins-Minerals (MENS MULTIVITAMIN PLUS PO) Take by mouth daily.    [provider]  Omega-3 Fatty Acids (OMEGA 3 PO) Take by mouth daily.    [provider]  valsartan-hydrochlorothiazide (DIOVAN-HCT) 320-25 MG tablet Take 1 tablet by mouth daily.    [provider]      Allergies    Patient has no known allergies.    Review of Systems   Review of Systems  Gastrointestinal:  Positive for abdominal pain, nausea and vomiting.  All other systems reviewed and are negative.   Physical Exam Updated Vital Signs BP (!) 156/41   Pulse (!) 58   Temp 98.8 F (37.1 C) (Oral)   Resp 17   Ht _0  (1.778 m)   Wt 99.8 kg   SpO2 96%   BMI 31.57 kg/m  Physical Exam Vitals and nursing note reviewed.  Constitutional:      General: He is not in acute distress.    Appearance: Normal appearance. He is normal weight. He is not ill-appearing.  HENT:     Head: Normocephalic and atraumatic.  Cardiovascular:  Rate and Rhythm: Regular rhythm. Bradycardia present.  Pulmonary:     Effort: Pulmonary effort is normal. No respiratory distress.     Breath sounds: Normal breath sounds.  Abdominal:     General: Abdomen is flat.     Palpations: Abdomen is soft.     Tenderness: There is no abdominal tenderness. There is no guarding or rebound. Negative signs include Murphy's sign, Rovsing's sign, McBurney's sign, psoas sign and obturator sign.  Musculoskeletal:        General: Normal range of motion.     Cervical back: Neck supple.  Skin:    General: Skin is warm and dry.     Capillary Refill: Capillary refill takes less than  2 seconds.  Neurological:     General: No focal deficit present.     Mental Status: He is alert and oriented to person, place, and time.  Psychiatric:        Mood and Affect: Mood normal.        Behavior: Behavior normal.     ED Results / Procedures / Treatments   Labs (all labs ordered are listed, but only abnormal results are displayed) Labs Reviewed  LIPASE, BLOOD - Abnormal; Notable for the following components:      Result Value   Lipase 124 (*)    All other components within normal limits  COMPREHENSIVE METABOLIC PANEL - Abnormal; Notable for the following components:   Potassium 3.0 (*)    Glucose, Bld 148 (*)    Calcium 10.5 (*)    Total Protein 8.4 (*)    All other components within normal limits  URINALYSIS, ROUTINE W REFLEX MICROSCOPIC - Abnormal; Notable for the following components:   Ketones, ur 15 (*)    Protein, ur 30 (*)    All other components within normal limits  RESP PANEL BY RT-PCR (FLU A&B, COVID) ARPGX2  CBC    EKG None  Radiology CT Abdomen Pelvis W Contrast  Result Date: 07/21/2022 CLINICAL DATA:  71 year old male with acute abdominal and pelvic pain. EXAM: CT ABDOMEN AND PELVIS WITH CONTRAST TECHNIQUE: Multidetector CT imaging of the abdomen and pelvis was performed using the standard protocol following bolus administration of intravenous contrast. RADIATION DOSE REDUCTION: This exam was performed according to the departmental dose-optimization program which includes automated exposure control, adjustment of the mA and/or kV according to patient size and/or use of iterative reconstruction technique. CONTRAST:  126m OMNIPAQUE IOHEXOL 300 MG/ML  SOLN COMPARISON:  None Available. FINDINGS: Lower chest: No acute abnormality. Hepatobiliary: Probable hepatic steatosis noted. Scattered hepatic cysts are identified. The gallbladder is unremarkable. There is no evidence of intrahepatic or extrahepatic biliary dilatation. Pancreas: Unremarkable Spleen:  Unremarkable Adrenals/Urinary Tract: Small renal cysts are noted and no follow-up imaging is recommended. There is no evidence of hydronephrosis or urinary calculi. A 2.7 cm LEFT adrenal mass measures 63 Hounsfield units on portal venous phase imaging and 32 Hounsfield units on washout, compatible with a benign adenoma. The RIGHT adrenal gland and bladder are unremarkable. Stomach/Bowel: Stomach is within normal limits. No evidence of bowel wall thickening, distention, or inflammatory changes. Colonic diverticulosis identified without evidence of acute diverticulitis. Vascular/Lymphatic: No significant vascular findings are present. No enlarged abdominal or pelvic lymph nodes. Reproductive: Prostate enlargement noted. Other: No ascites, focal collection or pneumoperitoneum. Musculoskeletal: No acute or suspicious bony abnormalities are noted. Degenerative changes in the lumbar spine and hips noted. IMPRESSION: 1. No evidence of acute abnormality. 2. Probable hepatic steatosis. 3. 2.7 cm LEFT adrenal  adenoma. 4. Prostate enlargement. Electronically Signed   By: Margarette Canada M.D.   On: 07/21/2022 16:34    Procedures Procedures    Medications Ordered in ED Medications  potassium chloride SA (KLOR-CON M) CR tablet 40 mEq (40 mEq Oral Given 07/21/22 2156)  potassium chloride 10 mEq in 100 mL IVPB (10 mEq Intravenous New Bag/Given 07/21/22 2114)  ondansetron (ZOFRAN-ODT) disintegrating tablet 4 mg (4 mg Oral Given 07/21/22 1307)  sodium chloride 0.9 % bolus 1,000 mL (0 mLs Intravenous Stopped 07/21/22 1701)  morphine (PF) 4 MG/ML injection 4 mg (4 mg Intravenous Given 07/21/22 1554)  ondansetron (ZOFRAN) injection 4 mg (4 mg Intravenous Given 07/21/22 1553)  iohexol (OMNIPAQUE) 300 MG/ML solution 100 mL (100 mLs Intravenous Contrast Given 07/21/22 1616)  sodium chloride 0.9 % bolus 1,000 mL (1,000 mLs Intravenous New Bag/Given 07/21/22 2113)  metoCLOPramide (REGLAN) injection 10 mg (10 mg Intravenous Given 07/21/22  2113)    ED Course/ Medical Decision Making/ A&P Clinical Course as of 07/21/22 2211  Sun Jul 21, 2022  1656 Reevaluation shows patient's abdominal pain and nausea have improved.  CT abdomen negative [AS]  2204 Patient successfully tolerated p.o. challenge of crackers, water and potassium pill [AS]    Clinical Course User Index [AS] Claudie Fisherman Grafton Folk, PA-C                           Medical Decision Making Amount and/or Complexity of Data Reviewed Labs: ordered. Radiology: ordered.  Risk Prescription drug management.  This patient presents to the ED for concern of nausea and vomiting, this involves an extensive number of treatment options, and is a complaint that carries with it a high risk of complications and morbidity.  The differential diagnosis includes The differential diagnosis for generalized abdominal pain includes, but is not limited to AAA, gastroenteritis, appendicitis, Bowel obstruction, Bowel perforation. Gastroparesis, DKA, Hernia, Inflammatory bowel disease, mesenteric ischemia, pancreatitis, peritonitis SBP, volvulus.    Additional history obtained from: Nursing notes from this visit.  I ordered, reviewed and interpreted labs which include: CBC, CMP, lipase, respiratory panel, urinalysis.  Lipase elevated at 124.  All other labs within normal limits.  Possible pancreatitis is treated with IV fluid resuscitation.   I ordered imaging studies including CT abdomen pelvis I independently visualized and interpreted imaging which showed normal I agree with the radiologist interpretation  Cardiac Monitoring:  The patient was maintained on a cardiac monitor.  I personally viewed and interpreted the cardiac monitored which showed an underlying rhythm of: Sinus bradycardia  Afebrile, hemodynamically stable.  Patient is a 71 year old male presents ED for evaluation of abdominal pain, nausea and vomiting.  Patient was initially treated with p.o. Zofran which she states did  not help.  He was then treated with IV Zofran which provided relief for approximately 2 hours.  He was then treated with IV Reglan which he states significantly improved his symptoms.  Lipase slightly elevated, all other labs normal.  CT abdomen normal.  Abdominal exam benign.  Patient states he did have oysters yesterday which he believes to be the cause of his symptoms today.  I believe patient does have enteritis.  P.o. challenge after IV Reglan with crackers, water and potassium pill was successful.  Initially had plans to admit patient for intractable nausea and vomiting, however successful p.o. challenge proved the patient is stable for discharge home.  He was not pleased with this decision stating that he did not want to  eat, however he was proven that he is able to eat.  Gave patient strict return precautions and a prescription for Reglan.  Stable at discharge.  At this time there does not appear to be any evidence of an acute emergency medical condition and the patient appears stable for discharge with appropriate outpatient follow up. Diagnosis was discussed with patient who verbalizes understanding of care plan and is agreeable to discharge. I have discussed return precautions with patient who verbalizes understanding. Patient encouraged to follow-up with their PCP within 1 week. All questions answered.  Patient's case discussed with Dr. Laverta Baltimore who agrees with plan to discharge with follow-up.   Note: Portions of this report may have been transcribed using voice recognition software. Every effort was made to ensure accuracy; however, inadvertent computerized transcription errors may still be present.          Final Clinical Impression(s) / ED Diagnoses Final diagnoses:  Nausea and vomiting, unspecified vomiting type    Rx / DC Orders ED Discharge Orders          Ordered    metoCLOPramide (REGLAN) 10 MG tablet  Every 6 hours        07/21/22 2206              Roylene Reason, Hershal Coria 07/21/22 2211    Margette Fast, MD 07/21/22 2351

## 2022-07-21 NOTE — Progress Notes (Signed)
Transferring facility: [facility] Requesting provider: Dr. Nanda Quinton (EDP at [facility]) Reason for transfer: admission for further evaluation and management of intractable nausea/vomiting, unable to tolerate po, hypokalemia.    71 year old male with medical history notable for diabetes, who presented to [facility] ED complaining of recurrent nausea /vomiting over the last few days, impairing ability to take p.o., and associated with mild abdominal discomfort.  Reportedly has history of similar presentations involving intractable nausea/vomiting associated with abdominal discomfort.  No hematemesis.  Vital signs in the ED were reported to be stable, including no hypotension or fever.   Labs were notable for CMP, which showed potassium 3.0, bicarbonate 22, anion gap 15 glucose 148.  Lipase mildly elevated at 124.  However, CT abdomen/pelvis showed no evidence of acute intra-abdominal/acute intrapelvic process, including no evidence of acute pancreatitis.   Following multiple doses of antiemetics in the emergency department, including 2 doses of Zofran as well as 1 dose of Reglan, the patient failed p.o. challenge, vomiting after attempting to eat a few crackers.  He had vomited shortly after receiving dose of oral potassium chloride.  EDP now ordering IV potassium for supplementation.  Subsequently, I accepted this patient for transfer for observation admission to a med telemetry bed at [facility] or [facility] (first available)  for further work-up and management of intractable nausea/vomiting associate with inability to tolerate p.o along with hypokalemia.      Check [email or web address] for on-call coverage.   Nursing staff, Please call [facility] number on Amion as soon as patient's arrival, so appropriate admitting provider can evaluate the pt.     Babs Bertin, DO Hospitalist

## 2022-07-21 NOTE — ED Triage Notes (Addendum)
Patient arrives with complaints of n/v, chills, and diarrhea that started this morning.  Rates abdominal pain a 5/10. Hx of vertigo Accompanied by his son.

## 2022-07-21 NOTE — ED Notes (Signed)
Pt aware of the need for Urine. Unable to currently.

## 2022-07-22 MED ORDER — PROMETHAZINE HCL 25 MG/ML IJ SOLN
INTRAMUSCULAR | Status: AC
  Filled 2022-07-22: qty 1

## 2022-07-22 NOTE — ED Provider Notes (Signed)
Patient waiting for admission bed.  Presented with abdominal pain, nausea, vomiting.  Lipase 124.  Potassium slightly low.  Initially attempted to be discharged; however, had ongoing nausea.  Was ultimately admitted to the hospitalist.  Patient received fluids and antiemetic overnight.  He is waiting for hospital bed.  5:53 AM Patient is tolerating fluids without difficulty.  States he feels much better after fluids overnight.  He is requesting discharge home.  Feel this is reasonable now that he is tolerating p.o.  Reglan was sent by prior provider to the pharmacy. Physical Exam  BP (!) 178/72   Pulse (!) 51   Temp 98.3 F (36.8 C) (Oral)   Resp 16   Ht 1.778 m ('5\' 10"'$ )   Wt 99.8 kg   SpO2 98%   BMI 31.57 kg/m   Physical Exam Awake, alert, no acute distress  Procedures  Procedures  ED Course / MDM   Clinical Course as of 07/22/22 0552  [facility] Jul 21, 2022  1656 Reevaluation shows patient's abdominal pain and nausea have improved.  CT abdomen negative [AS]  2204 Patient successfully tolerated p.o. challenge of crackers, water and potassium pill [AS]    Clinical Course User Index [AS] Claudie Fisherman Grafton Folk, PA-C   Medical Decision Making Amount and/or Complexity of Data Reviewed Labs: ordered. Radiology: ordered.  Risk Prescription drug management.   Problem List Items Addressed This Visit   None Visit Diagnoses     Nausea and vomiting, unspecified vomiting type    -  Primary             Dina Rich Barbette Hair, MD 07/22/22 [phone]

## 2022-07-22 NOTE — Progress Notes (Signed)
TRH Update:  I received update from EDP at [facility] (Dr. Dina Rich) that the patient's nausea/vomiting has improved overnight with IV fluids and antiemetics.  He is now able to tolerate p.o. and is being discharged from the ED to home by Dr. Dina Rich.  I updated our patient placement RN that the patient will no longer be admitted to the hospitalist service.      Babs Bertin, DO Hospitalist

## 2022-07-22 NOTE — ED Notes (Signed)
Pt verbalizes understanding of discharge instructions. Opportunity for questioning and answers were provided. Pt discharged from ED to home with family.    

## 2022-07-22 NOTE — ED Notes (Addendum)
Pt attempted to get out of bed to use the urinal and pulled his IV out. Bed linens changed, blood cleaned up,pt placed back on the monitor, and warm blankets provided. #20 restarted in this right arm per this RN. Tolerated well. Call bell within reach. Ice water given per pt request and MD states this is fine for him to po challenge.

## 2022-07-29 ENCOUNTER — Telehealth: Payer: Self-pay | Admitting: *Deleted

## 2022-07-29 NOTE — Telephone Encounter (Signed)
     Patient  visit on 07/22/2022  at [facility] ed was for nausea  Have you been able to follow up with your primary care physician? yes  The patient was or was not able to obtain any needed medicine or equipment.  Are there diet recommendations that you are having difficulty following?  Patient expresses understanding of discharge instructions and education provided has no other needs at this time.    [facility] [phone] [address] , [place] [address] Email : [email or web address]

## 2022-09-10 DIAGNOSIS — Z794 Long term (current) use of insulin: Secondary | ICD-10-CM | POA: Diagnosis not present

## 2022-09-10 DIAGNOSIS — H9313 Tinnitus, bilateral: Secondary | ICD-10-CM | POA: Diagnosis not present

## 2022-09-10 DIAGNOSIS — E1169 Type 2 diabetes mellitus with other specified complication: Secondary | ICD-10-CM | POA: Diagnosis not present

## 2022-09-10 DIAGNOSIS — I10 Essential (primary) hypertension: Secondary | ICD-10-CM | POA: Diagnosis not present

## 2022-10-08 DIAGNOSIS — R112 Nausea with vomiting, unspecified: Secondary | ICD-10-CM | POA: Diagnosis not present

## 2022-10-08 DIAGNOSIS — I10 Essential (primary) hypertension: Secondary | ICD-10-CM | POA: Diagnosis not present

## 2022-10-08 DIAGNOSIS — R42 Dizziness and giddiness: Secondary | ICD-10-CM | POA: Diagnosis not present

## 2022-10-08 DIAGNOSIS — E1169 Type 2 diabetes mellitus with other specified complication: Secondary | ICD-10-CM | POA: Diagnosis not present

## 2023-01-09 DIAGNOSIS — M130 Polyarthritis, unspecified: Secondary | ICD-10-CM | POA: Diagnosis not present

## 2023-01-09 DIAGNOSIS — U071 COVID-19: Secondary | ICD-10-CM | POA: Diagnosis not present

## 2023-01-09 DIAGNOSIS — E1169 Type 2 diabetes mellitus with other specified complication: Secondary | ICD-10-CM | POA: Diagnosis not present

## 2023-01-09 DIAGNOSIS — H8309 Labyrinthitis, unspecified ear: Secondary | ICD-10-CM | POA: Diagnosis not present

## 2023-01-09 DIAGNOSIS — I10 Essential (primary) hypertension: Secondary | ICD-10-CM | POA: Diagnosis not present

## 2023-01-14 ENCOUNTER — Encounter: Payer: Self-pay | Admitting: Gastroenterology

## 2023-02-12 ENCOUNTER — Ambulatory Visit: Payer: Medicare Other

## 2023-02-20 ENCOUNTER — Telehealth: Payer: Self-pay | Admitting: *Deleted

## 2023-02-20 NOTE — Telephone Encounter (Signed)
Attempt to reach pt for pre-visit.No answer just rang .Marland Kitchen Will attempt other # in profile Reached pt with other # listed but he was not able to do the pre-visit at this time. Transferred him to the main # so  he can reschedule his pre-visit. He stated he has the # if the call is dropped.

## 2023-03-07 ENCOUNTER — Encounter: Payer: Self-pay | Admitting: Gastroenterology

## 2023-03-07 ENCOUNTER — Ambulatory Visit (AMBULATORY_SURGERY_CENTER): Payer: Medicare Other | Admitting: *Deleted

## 2023-03-07 ENCOUNTER — Telehealth: Payer: Self-pay | Admitting: *Deleted

## 2023-03-07 VITALS — Ht 70.0 in | Wt 228.0 lb

## 2023-03-07 DIAGNOSIS — Z86010 Personal history of colonic polyps: Secondary | ICD-10-CM

## 2023-03-07 MED ORDER — NA SULFATE-K SULFATE-MG SULF 17.5-3.13-1.6 GM/177ML PO SOLN: 1.0000 | Freq: Once | ORAL | 0 refills | Status: AC

## 2023-03-07 NOTE — Progress Notes (Addendum)
Pt's name and DOB verified at the beginning of the pre-visit.  Pt denies any difficulty with ambulating,sitting, laying down or rolling side to side Gave both [facility] main # and MD on call # prior to instructions.  No egg or soy allergy known to patient  No issues known to pt with past sedation with any surgeries or procedures Pt denies having issues being intubated Pt has no issues moving head neck or swallowing No FH of Malignant Hyperthermia Pt is not on diet pills Pt is not on home 02  Pt is not on blood thinners  Pt denies issues with constipation  Pt is not on dialysis Pt denise any abnormal heart rhythms  Pt denies any upcoming cardiac testing Pt encouraged to use to use Singlecare or Goodrx to reduce cost  Patient's chart reviewed by Cathlyn Parsons CNRA prior to pre-visit and patient appropriate for the [facility].  Pre-visit completed and red dot placed by patient's name on their procedure day (on provider's schedule).  . Visit by phone Pt states weight is 228lb Instructed pt why it is important to and  to call if they have any changes in health or new medications. Directed them to the # given and on instructions.   Pt states they will.  Instructions reviewed with pt and pt states understanding. Instructed to review again prior to procedure. Pt states they will.  Instructions sent by mail with coupon  Pt states he takes Atrovastin 10mg  QD and Fish oil 100+ per ptmg. Epic would not take medications into list

## 2023-03-07 NOTE — Telephone Encounter (Signed)
Attempt to reach pt for pre-visit. LM with call back #. Will attempt other number in profile 

## 2023-03-19 ENCOUNTER — Encounter: Payer: Self-pay | Admitting: Gastroenterology

## 2023-03-19 ENCOUNTER — Ambulatory Visit (AMBULATORY_SURGERY_CENTER): Payer: Medicare Other | Admitting: Gastroenterology

## 2023-03-19 VITALS — BP 126/84 | HR 63 | Temp 97.5°F | Resp 19 | Ht 70.0 in | Wt 228.0 lb

## 2023-03-19 DIAGNOSIS — D123 Benign neoplasm of transverse colon: Secondary | ICD-10-CM

## 2023-03-19 DIAGNOSIS — Z86010 Personal history of colonic polyps: Secondary | ICD-10-CM

## 2023-03-19 DIAGNOSIS — K635 Polyp of colon: Secondary | ICD-10-CM | POA: Diagnosis not present

## 2023-03-19 DIAGNOSIS — D122 Benign neoplasm of ascending colon: Secondary | ICD-10-CM | POA: Diagnosis not present

## 2023-03-19 DIAGNOSIS — Z09 Encounter for follow-up examination after completed treatment for conditions other than malignant neoplasm: Secondary | ICD-10-CM | POA: Diagnosis not present

## 2023-03-19 DIAGNOSIS — D124 Benign neoplasm of descending colon: Secondary | ICD-10-CM

## 2023-03-19 DIAGNOSIS — I10 Essential (primary) hypertension: Secondary | ICD-10-CM | POA: Diagnosis not present

## 2023-03-19 DIAGNOSIS — D125 Benign neoplasm of sigmoid colon: Secondary | ICD-10-CM | POA: Diagnosis not present

## 2023-03-19 DIAGNOSIS — E119 Type 2 diabetes mellitus without complications: Secondary | ICD-10-CM | POA: Diagnosis not present

## 2023-03-19 MED ORDER — SODIUM CHLORIDE 0.9 % IV SOLN: 500.0000 mL | Freq: Once | INTRAVENOUS | Status: DC

## 2023-03-19 NOTE — Progress Notes (Signed)
 D5W given

## 2023-03-19 NOTE — Progress Notes (Signed)
Pt's states no medical or surgical changes since previsit or office visit.  Per Dr. Meridee Score- D5 is running through IV to keep patient's blood sugar stable during procedure.  CBG will be rechecked in recovery if patient is not in admitting for the 15 min recheck.

## 2023-03-19 NOTE — Op Note (Signed)
[facility] Patient Name: Harold [name] Procedure Date: 03/19/2023 10:35 AM MRN: [id number] Endoscopist: Corliss Parish , MD, [id number] Age: 72 Referring MD:  Date of Birth: 09-23-1951 Gender: Male Account #: [id number] Procedure:                Colonoscopy Indications:              Surveillance: Personal history of adenomatous                            polyps on last colonoscopy 3 years ago Medicines:                Monitored Anesthesia Care Procedure:                Pre-Anesthesia Assessment:                           - Prior to the procedure, a History and Physical                            was performed, and patient medications and                            allergies were reviewed. The patient's tolerance of                            previous anesthesia was also reviewed. The risks                            and benefits of the procedure and the sedation                            options and risks were discussed with the patient.                            All questions were answered, and informed consent                            was obtained. Prior Anticoagulants: The patient has                            taken no anticoagulant or antiplatelet agents. ASA                            Grade Assessment: III - A patient with severe                            systemic disease. After reviewing the risks and                            benefits, the patient was deemed in satisfactory                            condition to undergo the procedure.  After obtaining informed consent, the colonoscope                            was passed under direct vision. Throughout the                            procedure, the patient's blood pressure, pulse, and                            oxygen saturations were monitored continuously. The                            CF [id number] #[id number] was introduced through the anus                            and advanced  to the 3 cm into the ileum. The                            colonoscopy was performed without difficulty. The                            patient tolerated the procedure. The quality of the                            bowel preparation was good. The terminal ileum,                            ileocecal valve, appendiceal orifice, and rectum                            were photographed. Scope In: 10:40:14 AM Scope Out: 11:01:14 AM Scope Withdrawal Time: 0 hours 18 minutes 52 seconds  Total Procedure Duration: 0 hours 21 minutes 0 seconds  Findings:                 The digital rectal exam findings include                            hemorrhoids. Pertinent negatives include no                            palpable rectal lesions.                           The terminal ileum and ileocecal valve appeared                            normal.                           Four sessile polyps were found in the sigmoid colon                            (1), descending colon (1), hepatic flexure (1) and  ascending colon (1). The polyps were 2 to 10 mm in                            size. These polyps were removed with a cold snare.                            Resection and retrieval were complete.                           Multiple, greater than 20 sessile polyps were found                            in the recto-sigmoid colon. The polyps were 1 to 5                            mm in size. These are consistent with hyperplastic                            benign polyps. They have been previously sampled                            years ago with no significant change.                           Normal mucosa was found in the entire colon                            otherwise.                           Non-bleeding non-thrombosed internal hemorrhoids                            were found during retroflexion, during perianal                            exam and during digital exam. The  hemorrhoids were                            Grade II (internal hemorrhoids that prolapse but                            reduce spontaneously). Complications:            No immediate complications. Estimated Blood Loss:     Estimated blood loss was minimal. Impression:               - Hemorrhoids found on digital rectal exam.                           - The examined portion of the ileum was normal.                           - Four, 2 to 10 mm polyps in the sigmoid colon, in  the descending colon, at the hepatic flexure and in                            the ascending colon, removed with a cold snare.                            Resected and retrieved.                           - Hyperplastic polyps (previously sampled) in the                            rectosigmoid unchanged.                           - Normal mucosa in the entire examined colon                            otherwise.                           - Non-bleeding non-thrombosed internal hemorrhoids. Recommendation:           - The patient will be observed post-procedure,                            until all discharge criteria are met.                           - Discharge patient to home.                           - Patient has a contact number available for                            emergencies. The signs and symptoms of potential                            delayed complications were discussed with the                            patient. Return to normal activities tomorrow.                            Written discharge instructions were provided to the                            patient.                           - High fiber diet.                           - Use FiberCon 1-2 tablets PO daily.                           - Continue present medications.                           -  Await pathology results.                           - Repeat colonoscopy in 3/5/7 years for                             surveillance based on pathology results.                           - The findings and recommendations were discussed                            with the patient.                           - The findings and recommendations were discussed                            with the patient's family. Corliss Parish, MD 03/19/2023 11:07:51 AM

## 2023-03-19 NOTE — Patient Instructions (Signed)
Please read handouts provided. Continue present medications. Await pathology results. High Fiber Diet. Use FiberCon 1-2 tablets daily.   YOU HAD AN ENDOSCOPIC PROCEDURE TODAY AT THE [facility]:   Refer to the procedure report that was given to you for any specific questions about what was found during the examination.  If the procedure report does not answer your questions, please call your gastroenterologist to clarify.  If you requested that your care partner not be given the details of your procedure findings, then the procedure report has been included in a sealed envelope for you to review at your convenience later.  YOU SHOULD EXPECT: Some feelings of bloating in the abdomen. Passage of more gas than usual.  Walking can help get rid of the air that was put into your GI tract during the procedure and reduce the bloating. If you had a lower endoscopy (such as a colonoscopy or flexible sigmoidoscopy) you may notice spotting of blood in your stool or on the toilet paper. If you underwent a bowel prep for your procedure, you may not have a normal bowel movement for a few days.  Please Note:  You might notice some irritation and congestion in your nose or some drainage.  This is from the oxygen used during your procedure.  There is no need for concern and it should clear up in a day or so.  SYMPTOMS TO REPORT IMMEDIATELY:  Following lower endoscopy (colonoscopy or flexible sigmoidoscopy):  Excessive amounts of blood in the stool  Significant tenderness or worsening of abdominal pains  Swelling of the abdomen that is new, acute  Fever of 100F or higher   For urgent or emergent issues, a gastroenterologist can be reached at any hour by calling [phone]. Do not use MyChart messaging for urgent concerns.    DIET:  We do recommend a small meal at first, but then you may proceed to your regular diet.  Drink plenty of fluids but you should avoid alcoholic beverages for 24  hours.  ACTIVITY:  You should plan to take it easy for the rest of today and you should NOT DRIVE or use heavy machinery until tomorrow (because of the sedation medicines used during the test).    FOLLOW UP: Our staff will call the number listed on your records the next business day following your procedure.  We will call around 7:15- 8:00 am to check on you and address any questions or concerns that you may have regarding the information given to you following your procedure. If we do not reach you, we will leave a message.     If any biopsies were taken you will be contacted by phone or by letter within the next 1-3 weeks.  Please call us at [phone] if you have not heard about the biopsies in 3 weeks.    SIGNATURES/CONFIDENTIALITY: You and/or your care partner have signed paperwork which will be entered into your electronic medical record.  These signatures attest to the fact that that the information above on your After Visit Summary has been reviewed and is understood.  Full responsibility of the confidentiality of this discharge information lies with you and/or your care-partner. 

## 2023-03-19 NOTE — Progress Notes (Signed)
GASTROENTEROLOGY PROCEDURE H&P NOTE   Primary Care Physician: Renaye Rakers, MD  HPI: Harold Almond Sr. is a 72 y.o. male who presents for surveillance colonoscopy for prior adenomas.  Past Medical History:  Diagnosis Date   Back pain    Cataract    Diabetes mellitus without complication (HCC)    Hyperlipidemia    Hypertension    Past Surgical History:  Procedure Laterality Date   COLONOSCOPY  09/13/2014   COLONOSCOPY  01/12/2020   EYE SURGERY     KNEE SURGERY     left knee   MINOR CARPAL TUNNEL     POLYPECTOMY     TONSILLECTOMY     Current Outpatient Medications  Medication Sig Dispense Refill   amLODipine (NORVASC) 10 MG tablet Take 10 mg by mouth daily.     blood glucose meter kit and supplies Dispense based on patient and insurance preference. Use up to four times daily as directed. (FOR ICD-9 250.00, 250.01). 1 each 0   Cholecalciferol (VITAMIN D3) 3000 UNITS TABS Take 5,000 Units by mouth daily.     cloNIDine (CATAPRES) 0.1 MG tablet Take 0.1 mg by mouth at bedtime.     insulin glargine (LANTUS) 100 UNIT/ML injection Inject 0.25 mLs (25 Units total) into the skin daily. (Patient taking differently: Inject 10 Units into the skin daily.  In AM) 10 mL 2   LORazepam (ATIVAN) 0.5 MG tablet Take 1 tablet (0.5 mg total) by mouth every 6 (six) hours as needed (Dizziness). (Patient not taking: Reported on 03/07/2023) 30 tablet 0   meclizine (ANTIVERT) 25 MG tablet Take by mouth. As needed     metFORMIN (GLUCOPHAGE) 500 MG tablet Take 1 tablet (500 mg total) by mouth 2 (two) times daily with a meal. 60 tablet 0   metoCLOPramide (REGLAN) 10 MG tablet Take 1 tablet (10 mg total) by mouth every 6 (six) hours. (Patient not taking: Reported on 03/07/2023) 30 tablet 0   Multiple Vitamins-Minerals (MENS MULTIVITAMIN PLUS PO) Take by mouth daily.     Omega-3 Fatty Acids (FISH OIL) 1360 MG CAPS Take by mouth.     Omega-3 Fatty Acids (OMEGA 3 PO) Take by mouth daily. (Patient not  taking: Reported on 03/07/2023)     OVER THE COUNTER MEDICATION Take 10 mg by mouth daily at 6 (six) AM. Atrovastatin     tamsulosin (FLOMAX) 0.4 MG CAPS capsule Take 0.4 mg by mouth daily.     valsartan-hydrochlorothiazide (DIOVAN-HCT) 320-25 MG tablet Take 1 tablet by mouth daily.     No current facility-administered medications for this visit.    Current Outpatient Medications:    amLODipine (NORVASC) 10 MG tablet, Take 10 mg by mouth daily., Disp: , Rfl:    blood glucose meter kit and supplies, Dispense based on patient and insurance preference. Use up to four times daily as directed. (FOR ICD-9 250.00, 250.01)., Disp: 1 each, Rfl: 0   Cholecalciferol (VITAMIN D3) 3000 UNITS TABS, Take 5,000 Units by mouth daily., Disp: , Rfl:    cloNIDine (CATAPRES) 0.1 MG tablet, Take 0.1 mg by mouth at bedtime., Disp: , Rfl:    insulin glargine (LANTUS) 100 UNIT/ML injection, Inject 0.25 mLs (25 Units total) into the skin daily. (Patient taking differently: Inject 10 Units into the skin daily.  In AM), Disp: 10 mL, Rfl: 2   LORazepam (ATIVAN) 0.5 MG tablet, Take 1 tablet (0.5 mg total) by mouth every 6 (six) hours as needed (Dizziness). (Patient not taking: Reported on 03/07/2023),  Disp: 30 tablet, Rfl: 0   meclizine (ANTIVERT) 25 MG tablet, Take by mouth. As needed, Disp: , Rfl:    metFORMIN (GLUCOPHAGE) 500 MG tablet, Take 1 tablet (500 mg total) by mouth 2 (two) times daily with a meal., Disp: 60 tablet, Rfl: 0   metoCLOPramide (REGLAN) 10 MG tablet, Take 1 tablet (10 mg total) by mouth every 6 (six) hours. (Patient not taking: Reported on 03/07/2023), Disp: 30 tablet, Rfl: 0   Multiple Vitamins-Minerals (MENS MULTIVITAMIN PLUS PO), Take by mouth daily., Disp: , Rfl:    Omega-3 Fatty Acids (FISH OIL) 1360 MG CAPS, Take by mouth., Disp: , Rfl:    Omega-3 Fatty Acids (OMEGA 3 PO), Take by mouth daily. (Patient not taking: Reported on 03/07/2023), Disp: , Rfl:    OVER THE COUNTER MEDICATION, Take 10 mg by  mouth daily at 6 (six) AM. Atrovastatin, Disp: , Rfl:    tamsulosin (FLOMAX) 0.4 MG CAPS capsule, Take 0.4 mg by mouth daily., Disp: , Rfl:    valsartan-hydrochlorothiazide (DIOVAN-HCT) 320-25 MG tablet, Take 1 tablet by mouth daily., Disp: , Rfl:  No Known Allergies Family History  Problem Relation Age of Onset   Dementia Mother    Colon cancer Paternal Grandmother    Colon polyps Neg Hx    Esophageal cancer Neg Hx    Rectal cancer Neg Hx    Stomach cancer Neg Hx    Social History   Socioeconomic History   Marital status: Married    Spouse name: Not on file   Number of children: Not on file   Years of education: Not on file   Highest education level: Not on file  Occupational History   Not on file  Tobacco Use   Smoking status: Former    Types: Cigarettes    Quit date: 08/31/1983    Years since quitting: 39.5   Smokeless tobacco: Never  Vaping Use   Vaping Use: Never used  Substance and Sexual Activity   Alcohol use: No    Alcohol/week: 0.0 standard drinks of alcohol   Drug use: No   Sexual activity: Not on file  Other Topics Concern   Not on file  Social History Narrative   Not on file   Social Determinants of Health   Financial Resource Strain: Not on file  Food Insecurity: Not on file  Transportation Needs: Not on file  Physical Activity: Not on file  Stress: Not on file  Social Connections: Not on file  Intimate Partner Violence: Not on file    Physical Exam: There were no vitals filed for this visit. There is no height or weight on file to calculate BMI. GEN: NAD EYE: Sclerae anicteric ENT: MMM CV: Non-tachycardic GI: Soft, NT/ND NEURO:  Alert & Oriented x 3  Lab Results: No results for input(s): "WBC", "HGB", "HCT", "PLT" in the last 72 hours. BMET No results for input(s): "NA", "K", "CL", "CO2", "GLUCOSE", "BUN", "CREATININE", "CALCIUM" in the last 72 hours. LFT No results for input(s): "PROT", "ALBUMIN", "AST", "ALT", "ALKPHOS", "BILITOT",  "BILIDIR", "IBILI" in the last 72 hours. PT/INR No results for input(s): "LABPROT", "INR" in the last 72 hours.   Impression / Plan: This is a 72 y.o.male who presents for surveillance colonoscopy for prior adenomas.  The risks and benefits of endoscopic evaluation/treatment were discussed with the patient and/or family; these include but are not limited to the risk of perforation, infection, bleeding, missed lesions, lack of diagnosis, severe illness requiring hospitalization, as well as anesthesia  and sedation related illnesses.  The patient's history has been reviewed, patient examined, no change in status, and deemed stable for procedure.  The patient and/or family is agreeable to proceed.    Corliss Parish, MD [facility] Office # [phone]

## 2023-03-19 NOTE — Progress Notes (Signed)
Called to room to assist during endoscopic procedure.  Patient ID and intended procedure confirmed with present staff. Received instructions for my participation in the procedure from the performing physician.  

## 2023-03-19 NOTE — Progress Notes (Signed)
Vss nad trans to pacu 

## 2023-03-20 ENCOUNTER — Telehealth: Payer: Self-pay | Admitting: *Deleted

## 2023-03-20 NOTE — Telephone Encounter (Signed)
  Follow up Call-    Row Labels 03/19/2023   10:14 AM  Call back number   Section Header. No data exists in this row.   Post procedure Call Back phone  #   [phone]  Permission to leave phone message   Yes     Patient questions:  Do you have a fever, pain , or abdominal swelling? No. Pain Score  0 *  Have you tolerated food without any problems? Yes.    Have you been able to return to your normal activities? Yes.    Do you have any questions about your discharge instructions: Diet   No. Medications  No. Follow up visit  No.  Do you have questions or concerns about your Care? No.  Actions: * If pain score is 4 or above: No action needed, pain <4.

## 2023-03-25 ENCOUNTER — Encounter: Payer: Self-pay | Admitting: Gastroenterology

## 2023-05-01 ENCOUNTER — Other Ambulatory Visit: Payer: Self-pay | Admitting: Urology

## 2023-05-01 DIAGNOSIS — C61 Malignant neoplasm of prostate: Secondary | ICD-10-CM

## 2023-05-12 DIAGNOSIS — Z794 Long term (current) use of insulin: Secondary | ICD-10-CM | POA: Diagnosis not present

## 2023-05-12 DIAGNOSIS — E1169 Type 2 diabetes mellitus with other specified complication: Secondary | ICD-10-CM | POA: Diagnosis not present

## 2023-05-12 DIAGNOSIS — E782 Mixed hyperlipidemia: Secondary | ICD-10-CM | POA: Diagnosis not present

## 2023-05-12 DIAGNOSIS — I10 Essential (primary) hypertension: Secondary | ICD-10-CM | POA: Diagnosis not present

## 2023-08-04 ENCOUNTER — Ambulatory Visit
Admission: RE | Admit: 2023-08-04 | Discharge: 2023-08-04 | Disposition: A | Discharge status: Home / Self Care | Payer: Medicare Other | Source: Ambulatory Visit | Attending: Urology

## 2023-08-04 DIAGNOSIS — C61 Malignant neoplasm of prostate: Secondary | ICD-10-CM

## 2023-08-04 MED ORDER — GADOPICLENOL 0.5 MMOL/ML IV SOLN
10.0000 mL | Freq: Once | INTRAVENOUS | Status: AC | PRN
  Administered 2023-08-04: 10 mL via INTRAVENOUS

## 2023-08-10 ENCOUNTER — Ambulatory Visit
Admission: RE | Admit: 2023-08-10 | Discharge: 2023-08-10 | Disposition: A | Discharge status: Home / Self Care | Payer: Medicare Other | Source: Ambulatory Visit | Attending: Urology | Admitting: Urology

## 2023-08-10 MED ORDER — GADOPICLENOL 0.5 MMOL/ML IV SOLN
10.0000 mL | Freq: Once | INTRAVENOUS | Status: AC | PRN
  Administered 2023-08-10: 10 mL via INTRAVENOUS

## 2023-08-16 ENCOUNTER — Other Ambulatory Visit: Payer: Self-pay

## 2023-08-16 ENCOUNTER — Ambulatory Visit
Admission: EM | Admit: 2023-08-16 | Discharge: 2023-08-16 | Disposition: A | Discharge status: Other Acute Inpatient Hospital | Payer: Medicare Other | Attending: Internal Medicine | Admitting: Internal Medicine

## 2023-08-16 ENCOUNTER — Emergency Department (HOSPITAL_COMMUNITY): Payer: Medicare Other

## 2023-08-16 ENCOUNTER — Inpatient Hospital Stay (HOSPITAL_COMMUNITY)
Admission: EM | Admit: 2023-08-16 | Discharge: 2023-08-19 | DRG: 812 | Disposition: A | Discharge status: Home / Self Care | Payer: Medicare Other | Attending: Family Medicine | Admitting: Family Medicine

## 2023-08-16 ENCOUNTER — Encounter (HOSPITAL_COMMUNITY): Payer: Self-pay

## 2023-08-16 DIAGNOSIS — R531 Weakness: Secondary | ICD-10-CM

## 2023-08-16 DIAGNOSIS — Z820 Family history of epilepsy and other diseases of the nervous system: Secondary | ICD-10-CM | POA: Diagnosis not present

## 2023-08-16 DIAGNOSIS — I493 Ventricular premature depolarization: Secondary | ICD-10-CM | POA: Diagnosis not present

## 2023-08-16 DIAGNOSIS — F17290 Nicotine dependence, other tobacco product, uncomplicated: Secondary | ICD-10-CM | POA: Diagnosis present

## 2023-08-16 DIAGNOSIS — N179 Acute kidney failure, unspecified: Secondary | ICD-10-CM | POA: Diagnosis present

## 2023-08-16 DIAGNOSIS — K7689 Other specified diseases of liver: Secondary | ICD-10-CM | POA: Diagnosis not present

## 2023-08-16 DIAGNOSIS — Z79899 Other long term (current) drug therapy: Secondary | ICD-10-CM | POA: Diagnosis not present

## 2023-08-16 DIAGNOSIS — D3502 Benign neoplasm of left adrenal gland: Secondary | ICD-10-CM | POA: Diagnosis not present

## 2023-08-16 DIAGNOSIS — I10 Essential (primary) hypertension: Secondary | ICD-10-CM | POA: Diagnosis present

## 2023-08-16 DIAGNOSIS — Z800 Family history of malignant neoplasm of digestive organs: Secondary | ICD-10-CM

## 2023-08-16 DIAGNOSIS — K625 Hemorrhage of anus and rectum: Secondary | ICD-10-CM | POA: Diagnosis not present

## 2023-08-16 DIAGNOSIS — E861 Hypovolemia: Secondary | ICD-10-CM | POA: Diagnosis present

## 2023-08-16 DIAGNOSIS — D62 Acute posthemorrhagic anemia: Principal | ICD-10-CM | POA: Diagnosis present

## 2023-08-16 DIAGNOSIS — R270 Ataxia, unspecified: Secondary | ICD-10-CM | POA: Diagnosis not present

## 2023-08-16 DIAGNOSIS — N281 Cyst of kidney, acquired: Secondary | ICD-10-CM | POA: Diagnosis not present

## 2023-08-16 DIAGNOSIS — R001 Bradycardia, unspecified: Secondary | ICD-10-CM | POA: Diagnosis not present

## 2023-08-16 DIAGNOSIS — I959 Hypotension, unspecified: Secondary | ICD-10-CM | POA: Diagnosis not present

## 2023-08-16 DIAGNOSIS — R42 Dizziness and giddiness: Secondary | ICD-10-CM | POA: Diagnosis not present

## 2023-08-16 DIAGNOSIS — E876 Hypokalemia: Secondary | ICD-10-CM | POA: Diagnosis not present

## 2023-08-16 DIAGNOSIS — E119 Type 2 diabetes mellitus without complications: Secondary | ICD-10-CM | POA: Diagnosis not present

## 2023-08-16 DIAGNOSIS — Z7984 Long term (current) use of oral hypoglycemic drugs: Secondary | ICD-10-CM | POA: Diagnosis not present

## 2023-08-16 DIAGNOSIS — R972 Elevated prostate specific antigen [PSA]: Secondary | ICD-10-CM | POA: Diagnosis present

## 2023-08-16 DIAGNOSIS — K922 Gastrointestinal hemorrhage, unspecified: Secondary | ICD-10-CM | POA: Diagnosis not present

## 2023-08-16 DIAGNOSIS — E785 Hyperlipidemia, unspecified: Secondary | ICD-10-CM | POA: Diagnosis present

## 2023-08-16 DIAGNOSIS — R918 Other nonspecific abnormal finding of lung field: Secondary | ICD-10-CM | POA: Diagnosis not present

## 2023-08-16 DIAGNOSIS — Z794 Long term (current) use of insulin: Secondary | ICD-10-CM

## 2023-08-16 DIAGNOSIS — I951 Orthostatic hypotension: Secondary | ICD-10-CM | POA: Diagnosis present

## 2023-08-16 DIAGNOSIS — S0990XA Unspecified injury of head, initial encounter: Secondary | ICD-10-CM | POA: Diagnosis not present

## 2023-08-16 DIAGNOSIS — R55 Syncope and collapse: Secondary | ICD-10-CM | POA: Diagnosis not present

## 2023-08-16 LAB — URINALYSIS, ROUTINE W REFLEX MICROSCOPIC
Bilirubin Urine: NEGATIVE
Glucose, UA: NEGATIVE mg/dL
Ketones, ur: NEGATIVE mg/dL
Nitrite: NEGATIVE
Protein, ur: NEGATIVE mg/dL
Specific Gravity, Urine: 1.02 (ref 1.005–1.030)
pH: 5 (ref 5.0–8.0)

## 2023-08-16 LAB — CBC
HCT: 28 % — ABNORMAL LOW (ref 39.0–52.0)
Hemoglobin: 9.3 g/dL — ABNORMAL LOW (ref 13.0–17.0)
MCH: 33 pg (ref 26.0–34.0)
MCHC: 33.2 g/dL (ref 30.0–36.0)
MCV: 99.3 fL (ref 80.0–100.0)
Platelets: 280 10*3/uL (ref 150–400)
RBC: 2.82 MIL/uL — ABNORMAL LOW (ref 4.22–5.81)
RDW: 12.4 % (ref 11.5–15.5)
WBC: 15.8 10*3/uL — ABNORMAL HIGH (ref 4.0–10.5)
nRBC: 0 % (ref 0.0–0.2)

## 2023-08-16 LAB — BASIC METABOLIC PANEL
Anion gap: 10 (ref 5–15)
BUN: 41 mg/dL — ABNORMAL HIGH (ref 8–23)
CO2: 26 mmol/L (ref 22–32)
Calcium: 9.7 mg/dL (ref 8.9–10.3)
Chloride: 100 mmol/L (ref 98–111)
Creatinine, Ser: 2.12 mg/dL — ABNORMAL HIGH (ref 0.61–1.24)
GFR, Estimated: 32 mL/min — ABNORMAL LOW (ref >60)
Glucose, Bld: 92 mg/dL (ref 70–99)
Potassium: 3.2 mmol/L — ABNORMAL LOW (ref 3.5–5.1)
Sodium: 136 mmol/L (ref 135–145)

## 2023-08-16 LAB — LACTIC ACID, PLASMA: Lactic Acid, Venous: 1.3 mmol/L (ref 0.5–1.9)

## 2023-08-16 LAB — PREPARE RBC (CROSSMATCH)

## 2023-08-16 LAB — ABO/RH: ABO/RH(D): B POS

## 2023-08-16 LAB — GLUCOSE, CAPILLARY: Glucose-Capillary: 129 mg/dL — ABNORMAL HIGH (ref 70–99)

## 2023-08-16 MED ORDER — SODIUM CHLORIDE 0.9% IV SOLUTION
Freq: Once | INTRAVENOUS | Status: DC
Start: 2023-08-16 — End: 2023-08-19

## 2023-08-16 MED ORDER — LACTATED RINGERS IV BOLUS
1000.0000 mL | Freq: Once | INTRAVENOUS | Status: AC
  Administered 2023-08-16: 1000 mL via INTRAVENOUS

## 2023-08-16 MED ORDER — SODIUM CHLORIDE 0.9% IV SOLUTION
Freq: Once | INTRAVENOUS | Status: DC
Start: 2023-08-16 — End: 2023-08-16

## 2023-08-16 MED ORDER — INSULIN ASPART 100 UNIT/ML IJ SOLN
0.0000 [IU] | Freq: Three times a day (TID) | INTRAMUSCULAR | Status: DC
  Administered 2023-08-18: 2 [IU] via SUBCUTANEOUS

## 2023-08-16 MED ORDER — INSULIN GLARGINE-YFGN 100 UNIT/ML ~~LOC~~ SOLN
5.0000 [IU] | Freq: Every day | SUBCUTANEOUS | Status: DC
  Administered 2023-08-17: 5 [IU] via SUBCUTANEOUS
  Filled 2023-08-16: qty 0.05

## 2023-08-16 MED ORDER — SODIUM CHLORIDE 0.9 % IV BOLUS
500.0000 mL | Freq: Once | INTRAVENOUS | Status: AC
  Administered 2023-08-16: 500 mL via INTRAVENOUS

## 2023-08-16 MED ORDER — CEFTRIAXONE SODIUM 1 G IJ SOLR
1.0000 g | Freq: Once | INTRAMUSCULAR | Status: AC
  Administered 2023-08-16: 1 g via INTRAVENOUS
  Filled 2023-08-16: qty 10

## 2023-08-16 MED ORDER — POTASSIUM CHLORIDE CRYS ER 20 MEQ PO TBCR
40.0000 meq | EXTENDED_RELEASE_TABLET | Freq: Once | ORAL | Status: AC
  Administered 2023-08-16: 40 meq via ORAL
  Filled 2023-08-16: qty 2

## 2023-08-16 MED ORDER — ATORVASTATIN CALCIUM 10 MG PO TABS
10.0000 mg | ORAL_TABLET | Freq: Every day | ORAL | Status: DC
Start: 2023-08-17 — End: 2023-08-19
  Administered 2023-08-17 – 2023-08-19 (×3): 10 mg via ORAL
  Filled 2023-08-16 (×3): qty 1

## 2023-08-16 NOTE — ED Triage Notes (Signed)
Pt reports he had biopsy on Tuesday and "lost a lot of blood". Since then he has been feeling "weak" and "dizzy". States he fell yesterday. BP 91/57- reports he did take his BP meds today. Alert. Family at bedside. Ervin Knack, NP at bedside to assess during triage

## 2023-08-16 NOTE — ED Provider Notes (Signed)
[facility]    CSN: [id number] Arrival date & time: 08/16/23  1244      History   Chief Complaint Chief Complaint  Patient presents with   Weakness    HPI Harold RECUPERO Sr. is a 72 y.o. male.   Patient presents with generalized weakness, confusion, feeling off balance, dizziness that started 2 days prior.  His son is at bedside and reports that he has seemed off balance and confused at times.  Patient reports that he had a prostate biopsy approximately 5 days ago.  He reports that "he lost a lot of blood".  States that he was urinating blood for about 24 hours after procedure which has now stopped.  Denies that he was given any blood products during the procedure.  Denies any recent fever.  Denies any blood from stool or any other part of the body.  He denies that he takes blood thinning medications.  Reports that his weakness did cause a fall yesterday but he denies hitting head or losing consciousness.  Denies chest pain, shortness of breath, headache, blurred vision.   Weakness   Past Medical History:  Diagnosis Date   Back pain    Cataract    Diabetes mellitus without complication (HCC)    Hyperlipidemia    Hypertension     Patient Active Problem List   Diagnosis Date Noted   Intractable nausea and vomiting 07/21/2022   Diabetes mellitus, type II, insulin dependent (HCC) 07/14/2018   Diabetic hyperosmolar non-ketotic state (HCC) 07/22/2017   Hypertension 07/20/2017   Infected blister 05/19/2017   Sensorineural hearing loss (SNHL), bilateral 11/07/2016   Tinnitus, bilateral 11/07/2016    Past Surgical History:  Procedure Laterality Date   COLONOSCOPY  09/13/2014   COLONOSCOPY  01/12/2020   EYE SURGERY     KNEE SURGERY     left knee   MINOR CARPAL TUNNEL     POLYPECTOMY     TONSILLECTOMY         Home Medications    Prior to Admission medications   Medication Sig Start Date End Date Taking? Authorizing Provider  amLODipine (NORVASC)  10 MG tablet Take 10 mg by mouth daily.    [provider]  blood glucose meter kit and supplies Dispense based on patient and insurance preference. Use up to four times daily as directed. (FOR ICD-9 250.00, 250.01). 07/22/17   Zannie Cove, MD  Cholecalciferol (VITAMIN D3) 3000 UNITS TABS Take 5,000 Units by mouth daily.    [provider]  cloNIDine (CATAPRES) 0.1 MG tablet Take 0.1 mg by mouth at bedtime.    [provider]  insulin glargine (LANTUS) 100 UNIT/ML injection Inject 0.25 mLs (25 Units total) into the skin daily. Patient taking differently: Inject 10 Units into the skin daily.  In AM 07/23/17   Zannie Cove, MD  LORazepam (ATIVAN) 0.5 MG tablet Take 1 tablet (0.5 mg total) by mouth every 6 (six) hours as needed (Dizziness). Patient not taking: Reported on 03/07/2023 01/30/21   Mancel Bale, MD  meclizine (ANTIVERT) 25 MG tablet Take by mouth. As needed 10/08/22   [provider]  metFORMIN (GLUCOPHAGE) 500 MG tablet Take 1 tablet (500 mg total) by mouth 2 (two) times daily with a meal. 07/22/17   Zannie Cove, MD  metoCLOPramide (REGLAN) 10 MG tablet Take 1 tablet (10 mg total) by mouth every 6 (six) hours. Patient not taking: Reported on 03/07/2023 07/21/22   Michelle Piper, PA-C  Multiple Vitamins-Minerals (MENS  MULTIVITAMIN PLUS PO) Take by mouth daily.    [provider]  Omega-3 Fatty Acids (FISH OIL) 1360 MG CAPS Take by mouth.    [provider]  Omega-3 Fatty Acids (OMEGA 3 PO) Take by mouth daily.    [provider]  OVER THE COUNTER MEDICATION Take 10 mg by mouth daily at 6 (six) AM. Atrovastatin    [provider]  tamsulosin (FLOMAX) 0.4 MG CAPS capsule Take 0.4 mg by mouth daily. 12/24/22   [provider]  valsartan-hydrochlorothiazide (DIOVAN-HCT) 320-25 MG tablet Take 1 tablet by mouth daily.    [provider]    Family History Family History  Problem Relation Age  of Onset   Dementia Mother    Colon cancer Paternal Grandmother    Colon polyps Neg Hx    Esophageal cancer Neg Hx    Rectal cancer Neg Hx    Stomach cancer Neg Hx     Social History Social History   Tobacco Use   Smoking status: Former    Current packs/day: 0.00    Types: Cigarettes    Quit date: 08/31/1983    Years since quitting: 39.9   Smokeless tobacco: Never  Vaping Use   Vaping status: Never Used  Substance Use Topics   Alcohol use: No    Alcohol/week: 0.0 standard drinks of alcohol   Drug use: No     Allergies   Patient has no known allergies.   Review of Systems Review of Systems Per HPI  Physical Exam Triage Vital Signs ED Triage Vitals [08/16/23 1351]  Encounter Vitals Group     BP (!) 91/57     Systolic BP Percentile      Diastolic BP Percentile      Pulse Rate 74     Resp 18     Temp 98.2 F (36.8 C)     Temp Source Oral     SpO2 100 %     Weight      Height      Head Circumference      Peak Flow      Pain Score      Pain Loc      Pain Education      Exclude from Growth Chart    No data found.  Updated Vital Signs BP (!) 93/50 (BP Location: Right Arm)   Pulse 74   Temp 98.2 F (36.8 C) (Oral)   Resp 18   SpO2 100%   Visual Acuity Right Eye Distance:   Left Eye Distance:   Bilateral Distance:    Right Eye Near:   Left Eye Near:    Bilateral Near:     Physical Exam Constitutional:      General: He is not in acute distress.    Appearance: Normal appearance. He is not toxic-appearing or diaphoretic.  HENT:     Head: Normocephalic and atraumatic.  Eyes:     Extraocular Movements: Extraocular movements intact.     Conjunctiva/sclera: Conjunctivae normal.     Pupils: Pupils are equal, round, and reactive to light.  Cardiovascular:     Rate and Rhythm: Normal rate and regular rhythm.     Pulses: Normal pulses.     Heart sounds: Normal heart sounds.  Pulmonary:     Effort: Pulmonary effort is normal. No respiratory  distress.     Breath sounds: Normal breath sounds.  Neurological:     General: No focal deficit present.     Mental  Status: He is alert and oriented to person, place, and time. Mental status is at baseline.     Cranial Nerves: Cranial nerves 2-12 are intact.     Sensory: Sensation is intact.     Motor: Motor function is intact.     Coordination: Coordination is intact.     Gait: Gait is intact.  Psychiatric:        Mood and Affect: Mood normal.        Behavior: Behavior normal.        Thought Content: Thought content normal.        Judgment: Judgment normal.      UC Treatments / Results  Labs (all labs ordered are listed, but only abnormal results are displayed) Labs Reviewed - No data to display  EKG   Radiology No results found.  Procedures Procedures (including critical care time)  Medications Ordered in UC Medications - No data to display  Initial Impression / Assessment and Plan / UC Course  I have reviewed the triage vital signs and the nursing notes.  Pertinent labs & imaging results that were available during my care of the patient were reviewed by me and considered in my medical decision making (see chart for details).     Patient's blood pressure is 90s systolic.  He is also reporting generalized weakness and dizziness after recent procedure which is concerning for symptomatic hypotension versus acute blood loss versus sepsis.  Advised patient and son that he would need to go to the emergency department for further evaluation and management and they are agreeable with plan.  Vital signs are stable and patient is sitting upright in chair in no acute distress so agree with patient's son transporting him to the ER.  EKG completed that was normal sinus rhythm with PVCs. Final Clinical Impressions(s) / UC Diagnoses   Final diagnoses:  Hypotension, unspecified hypotension type  Generalized weakness     Discharge Instructions      Please go to the emergency  department as soon as you leave urgent care for further evaluation and management.    ED Prescriptions   None    PDMP not reviewed this encounter.   Gustavus Bryant, [place] 08/16/23 1421

## 2023-08-16 NOTE — Assessment & Plan Note (Addendum)
Symptomatic; however with stable vitals. Not currently bleeding. Plan to monitor closely and trend Hgb.  S/p 0.5L Bolus. Admit to family Medicine Teaching service, Attending Dr. McDiarmid, Med-Tele Trend CBC in a.m. Consult urology in a.m. Holding home blood pressure meds F/u blood cultures 2u pRBC, post H&H

## 2023-08-16 NOTE — ED Notes (Signed)
Pts BP 86/43, primary nurse is in room with patient.

## 2023-08-16 NOTE — ED Triage Notes (Addendum)
Reports had a biopsy on Tuesday and was urinating blood and having bloody BMs.  Reports he lost a lot of blood.  Reports Thursday and Friday he was so weak he couldn't walk.  Patient also complains of dizziness. Reports blood has stopped at this time.

## 2023-08-16 NOTE — ED Notes (Signed)
Patient is being discharged from the Urgent Care and sent to the Emergency Department via pov . Per Ervin Knack, NP, patient is in need of higher level of care due to symptomatic hypotension. Patient is aware and verbalizes understanding of plan of care. Pt's son is present to drive. Vitals:   08/16/23 1351 08/16/23 1358  BP: (!) 91/57 (!) 93/50  Pulse: 74   Resp: 18   Temp: 98.2 F (36.8 C)   SpO2: 100%

## 2023-08-16 NOTE — ED Notes (Signed)
Per Dr. [name], administer LR bolus after blood transfusion is complete.

## 2023-08-16 NOTE — Plan of Care (Signed)
FMTS Brief Progress Note  S: Went to evaluate patient at bedside.  He has no acute concerns.  Discussed plan of care, patient expressed understanding and agreement with plan.   O: BP 116/73 (BP Location: Left Arm)   Pulse 72   Temp 98.1 F (36.7 C) (Oral)   Resp 16   Ht 5\' 10"  (1.778 m)   Wt 103.4 kg   SpO2 96%   BMI 32.71 kg/m    General: NAD, pleasant, resting comfortably in bed Cardio: RRR, no MRG Respiratory: CTAB, normal wob on RA GI: Abdomen is soft, not tender, not distended. BS present Skin: Warm and dry   A/P: Acute blood loss anemia - Complete 2 unit PRBCs - Follow-up post H&H - AM CBC  Type 2 diabetes Hold home metformin. - Lantus 10 units daily, titrate as needed - Sensitive sliding scale, with CBG checks  AKI - Bolus 1L LR, after transfusion - A.m. BMP  - Orders reviewed. Labs for AM ordered, which was adjusted as needed.  - If condition changes, please notify family medicine teaching service.   Tiffany Kocher, DO 08/16/2023, 7:43 PM PGY-2, [facility] Night Resident  Please page [phone] with questions.

## 2023-08-16 NOTE — H&P (Cosign Needed Addendum)
Hospital Admission History and Physical Service Pager: [phone]  Patient name: Harold RAPTIS Sr. Medical record number: [id number] Date of Birth: Jan 26, 1951 Age: 72 y.o. Gender: male  Primary Care Provider: Renaye Rakers, MD Consultants: Urology in a.m. Code Status: Full code Preferred Emergency Contact:  Contact Information     Name Relation Home Work Mobile   Kinnamon,DINDI Daughter [phone]     MICHAELJOSEPH, REVOLORIO Son [phone]     Donnellan,Laurel Sister [phone]        Other Contacts   None on File      Chief Complaint: Hematochezia, symptomatic anemia  Assessment and Plan: Harold Gingras. is a 72 y.o. male presenting with symptomatic anemia following episode of hematochezia following prostate biopsy. Differential for this patient's presentation of this includes bleeding secondary to prostate biopsy, diverticula, AVMs, internal hemorrhoid, colorectal cancer.  Given that the bleeding have been following the prostate biopsy, this is most likely but will rule out most other conditions given recent colonoscopy on 03/19/23.   Assessment & Plan Acute blood loss anemia Symptomatic; however with stable vitals. Not currently bleeding. Plan to monitor closely and trend Hgb.  S/p 0.5L Bolus. Admit to family Medicine Teaching service, Attending Dr. McDiarmid, Med-Tele Trend CBC in a.m. Consult urology in a.m. Holding home blood pressure meds F/u blood cultures 2u pRBC, post H&H  Type 2 diabetes mellitus without complication, with long-term current use of insulin (HCC) Reports metformin 1000mg  BID and 10u Lantus daily at home. Lantus 10 units daily, titrate as needed Hold metformin Sensitive sliding scale CBGs with meals and at night A1c Acute kidney injury (HCC) Baseline around 1.0.  Currently 2.1.  Likely secondary to ATN and hypovolemia in the setting of acute blood loss. Plan to monitor creatinine with fluid resucitation AM BMP Hold  metformin 1L LR Bolus   Chronic and Stable Conditions: Hypertension: Holding medications currently Hyperlipidemia: Continue Lipitor   FEN/GI: Normal diet VTE Prophylaxis: SCDs, holding VTE prophylaxis due to bleed  Disposition: Home pending stable hemoglobin  History of Present Illness:  Harold Punch. is a 72 y.o. male with past history of 2 diabetes, hypertension, hyperlipidemia presenting with symptomatic anemia following episode hematochezia following prostate biopsy.  Dr. Marlou Porch is urologist. Had prostate biopsy 10/29. Had 13 diapers after coming back from procedure, dark red clots from rectum.  Bleeding stopped 10/30 morning. Denies prior bleeding or rectal bleeding. Appetite has been decreased. Attempted to drink lots of water and had been urinating frequently.  Since the bleeding he reports being very fatigued and having intermittent palpitations.  Furthermore he endorses orthostatic hypotension and had a fall yesterday.  Did not fall he reported no transient loss of consciousness or head injury.  Reports being on no blood thinners.  In the ED, he received 0.5 L bolus.  Given symptomatic anemia, he was typed and screened and ordered 2 units of blood. Blood cultures collected to rule out bacteremia from prostate injury and given 1 dose ceftriaxone in ED.  Review Of Systems: Per HPI with the following additions: Negative for hematemesis, hemoptysis, melena, hematuria.  Negative for fever, chills, nausea, vomiting, diarrhea, constipation, focal weakness.  Pertinent Past Medical History: Type 2 diabetes mellitus Hypertension Hyperlipidemia  Remainder reviewed in history tab.   Pertinent Past Surgical History: Prostate biopsy 10/29 Tonsillectomy  Arthroscopic Knee Surgery  Remainder reviewed in history tab.   Pertinent Social History: Tobacco use: Smokes Cigars once a month, stopping smoking cigarette 50 years Alcohol use: socially drinks  Other Substance use:  None Lives with at home alone and this spouse died several years ago.  Reports that he has social support including son is at bedside.  Pertinent Family History: Reports none.  Remainder reviewed in history tab.   Important Outpatient Medications: Amlodipine Clonidine Lipitor Lantus 10u daily Meclizine as needed, rarely take Metformin 1000 BID Zofran - As needed Reglan - As needed Flomax - Denies taking Diovan - Denies  Remainder reviewed in medication history.   Objective: BP (!) 110/58   Pulse 73   Temp 98.9 F (37.2 C) (Oral)   Resp 16   Ht 5\' 10"  (1.778 m)   Wt 103.4 kg   SpO2 99%   BMI 32.71 kg/m  Exam: General: Calm and cooperative, laying in bed. Eyes: No scleral icterus or conjunctival pallor. ENTM: Mucous membrane moist and without lesions or erythema. Neck: Supple Cardiovascular: Normal rate and rhythm.  Normal S1 and S2.  No murmurs rubs or gallops.  No lower extremity edema bilaterally. Respiratory: Lungs clear to auscultation bilaterally. Gastrointestinal: No tenderness to palpation. MSK: No gross issues noted. Derm: No lesions noted on skin. Neuro: Attention and memory intact but not formally tested.  No focal deficits noted. Psych: Behavior appropriate.  Affect euthymic.  Labs:  CBC BMET  Recent Labs  Lab 08/16/23 1442  WBC 15.8*  HGB 9.3*  HCT 28.0*  PLT 280   Recent Labs  Lab 08/16/23 1442  NA 136  K 3.2*  CL 100  CO2 26  BUN 41*  CREATININE 2.12*  GLUCOSE 92  CALCIUM 9.7     Type and screen: B+ Blood cultures: in process Urinalysis: Unremarkable  .  EKG: Sinus tachycardia with occasional PVCs (also seen on telemetry in the room)   Imaging Studies Performed:  CT head without contrast Impression from Radiologist: No acute intracranial pathology    [name], MD 08/16/2023, 6:31 PM PGY-1, [facility]  FPTS Intern pager: [phone], text pages welcome Secure chat group [facility]  Teaching Service   I was personally present and performed or re-performed the history, physical exam and medical decision making activities of this student/resident and have verified that the student/resident's  findings are accurately documented in the student/resident's note. I have made edits and changes where appropriate, and agree with plan.  Celine Mans, MD, PGY-2 [facility] 7:54 PM 08/16/2023                   08/16/2023, 7:54 PM

## 2023-08-16 NOTE — Discharge Instructions (Signed)
Please go to the emergency department as soon as you leave urgent care for further evaluation and management. ?

## 2023-08-16 NOTE — ED Notes (Signed)
ED TO INPATIENT HANDOFF REPORT  ED Nurse Name and Phone #: Einar Grad [phone]  S Name/Age/Gender Harold Almond Sr. 72 y.o. male Room/Bed: 002C/002C  Code Status   Code Status: Full Code  Home/SNF/Other Home Patient oriented to: self, place, time, and situation Is this baseline? Yes   Triage Complete: Triage complete  Chief Complaint Acute blood loss anemia [D62]  Triage Note Reports had a biopsy on Tuesday and was urinating blood and having bloody BMs.  Reports he lost a lot of blood.  Reports Thursday and Friday he was so weak he couldn't walk.  Patient also complains of dizziness. Reports blood has stopped at this time.    Allergies No Known Allergies  Level of Care/Admitting Diagnosis ED Disposition     ED Disposition  Admit   Condition  --   Comment  Hospital Area: [facility] [[id number]]  Level of Care: Med-Surg [16]  May admit patient to [facility] or [facility] if equivalent level of care is available:: No  Covid Evaluation: Asymptomatic - no recent exposure (last 10 days) testing not required  Diagnosis: Acute blood loss anemia [213086]  Admitting Physician: [name] [[id number]]  Attending Physician: Acquanetta Belling D [[id number]]  Certification:: I certify this patient will need inpatient services for at least 2 midnights  Expected Medical Readiness: 08/18/2023          B Medical/Surgery History Past Medical History:  Diagnosis Date   Back pain    Cataract    Diabetes mellitus without complication (HCC)    Hyperlipidemia    Hypertension    Past Surgical History:  Procedure Laterality Date   COLONOSCOPY  09/13/2014   COLONOSCOPY  01/12/2020   EYE SURGERY     KNEE SURGERY     left knee   MINOR CARPAL TUNNEL     POLYPECTOMY     TONSILLECTOMY       A IV Location/Drains/Wounds Patient Lines/Drains/Airways Status     Active Line/Drains/Airways     Name Placement date Placement time Site Days   Peripheral IV 08/16/23 18  G Anterior;Left Forearm 08/16/23  1554  Forearm  less than 1   Peripheral IV 08/16/23 20 G Right Antecubital 08/16/23  1610  Antecubital  less than 1            Intake/Output Last 24 hours No intake or output data in the 24 hours ending 08/16/23 1807  Labs/Imaging Results for orders placed or performed during the hospital encounter of 08/16/23 (from the past 48 hour(s))  Basic metabolic panel     Status: Abnormal   Collection Time: 08/16/23  2:42 PM  Result Value Ref Range   Sodium 136 135 - 145 mmol/L   Potassium 3.2 (L) 3.5 - 5.1 mmol/L   Chloride 100 98 - 111 mmol/L   CO2 26 22 - 32 mmol/L   Glucose, Bld 92 70 - 99 mg/dL    Comment: Glucose reference range applies only to samples taken after fasting for at least 8 hours.   BUN 41 (H) 8 - 23 mg/dL   Creatinine, Ser 2.95 (H) 0.61 - 1.24 mg/dL   Calcium 9.7 8.9 - 28.4 mg/dL   GFR, Estimated 32 (L) >60 mL/min    Comment: (NOTE) Calculated using the CKD-EPI Creatinine Equation (2021)    Anion gap 10 5 - 15    Comment: Performed at [facility], [address], [place], [place] [address]  CBC     Status: Abnormal  Collection Time: 08/16/23  2:42 PM  Result Value Ref Range   WBC 15.8 (H) 4.0 - 10.5 K/uL   RBC 2.82 (L) 4.22 - 5.81 MIL/uL   Hemoglobin 9.3 (L) 13.0 - 17.0 g/dL   HCT 40.9 (L) 81.1 - 91.4 %   MCV 99.3 80.0 - 100.0 fL   MCH 33.0 26.0 - 34.0 pg   MCHC 33.2 30.0 - 36.0 g/dL   RDW 78.2 95.6 - 21.3 %   Platelets 280 150 - 400 K/uL   nRBC 0.0 0.0 - 0.2 %    Comment: Performed at [facility], [address] [place], [place] [address]  Urinalysis, Routine w reflex microscopic -Urine, Clean Catch     Status: Abnormal   Collection Time: 08/16/23  2:42 PM  Result Value Ref Range   Color, Urine YELLOW YELLOW   APPearance CLEAR CLEAR   Specific Gravity, Urine 1.020 1.005 - 1.030   pH 5.0 5.0 - 8.0   Glucose, UA NEGATIVE NEGATIVE mg/dL   Hgb urine dipstick SMALL (A) NEGATIVE   Bilirubin Urine NEGATIVE  NEGATIVE   Ketones, ur NEGATIVE NEGATIVE mg/dL   Protein, ur NEGATIVE NEGATIVE mg/dL   Nitrite NEGATIVE NEGATIVE   Leukocytes,Ua TRACE (A) NEGATIVE   RBC / HPF 0-5 0 - 5 RBC/hpf   WBC, UA 0-5 0 - 5 WBC/hpf   Bacteria, UA RARE (A) NONE SEEN   Squamous Epithelial / HPF 0-5 0 - 5 /HPF   Mucus PRESENT    Hyaline Casts, UA PRESENT     Comment: Performed at [facility], [address] [place], [place] [address]  Type and screen [facility]     Status: None (Preliminary result)   Collection Time: 08/16/23  2:54 PM  Result Value Ref Range   ABO/RH(D) B POS    Antibody Screen NEG    Sample Expiration 08/19/2023,2359    Unit Number [id number]    Blood Component Type RED CELLS,LR    Unit division 00    Status of Unit ISSUED    Transfusion Status OK TO TRANSFUSE    Crossmatch Result      Compatible Performed at [facility], [address] [place], [place] [address]   ABO/Rh     Status: None   Collection Time: 08/16/23  4:11 PM  Result Value Ref Range   ABO/RH(D)      B POS Performed at [facility], [address], [place], [place] [address]   Lactic acid, plasma     Status: None   Collection Time: 08/16/23  4:13 PM  Result Value Ref Range   Lactic Acid, Venous 1.3 0.5 - 1.9 mmol/L    Comment: Performed at [facility], [address], [place], [place] [address]  Prepare RBC     Status: None   Collection Time: 08/16/23  5:10 PM  Result Value Ref Range   Order Confirmation      ORDER PROCESSED BY BLOOD BANK Performed at [facility], [address], [place], [place] [address]    CT Head Wo Contrast  Result Date: 08/16/2023 CLINICAL DATA:  Ataxia, head trauma EXAM: CT HEAD WITHOUT CONTRAST TECHNIQUE: Contiguous axial images were obtained from the base of the skull through the vertex without intravenous contrast. RADIATION DOSE REDUCTION: This exam was performed according to the departmental dose-optimization program which  includes automated exposure control, adjustment of the mA and/or kV according to patient size  and/or use of iterative reconstruction technique. COMPARISON:  Brain MRI 01/30/2021 FINDINGS: Brain: There is no acute intracranial hemorrhage, extra-axial fluid collection, or acute infarct. Parenchymal volume is within expected limits for age. The ventricles are normal in size. Gray-white differentiation is preserved. The pituitary and suprasellar region are normal. There is no mass lesion. There is no mass effect or midline shift. Vascular: No hyperdense vessel or unexpected calcification. Skull: Normal. Negative for fracture or focal lesion. Sinuses/Orbits: The paranasal sinuses are clear. Bilateral lens implants are in place. The globes and orbits are otherwise unremarkable. Other: The mastoid air cells and middle ear cavities are clear. IMPRESSION: No acute intracranial pathology. Electronically Signed   By: Lesia Hausen M.D.   On: 08/16/2023 17:57    Pending Labs Unresulted Labs (From admission, onward)     Start     Ordered   08/17/23 0500  Basic metabolic panel  Tomorrow morning,   R        08/16/23 1739   08/17/23 0500  CBC  Tomorrow morning,   R        08/16/23 1739   08/17/23 0500  Hemoglobin A1c  Tomorrow morning,   R       Comments: To assess prior glycemic control    08/16/23 1739   08/16/23 1540  Blood culture (routine x 2)  BLOOD CULTURE X 2,   R (with STAT occurrences)      08/16/23 1539            Vitals/Pain Today's Vitals   08/16/23 1439 08/16/23 1545 08/16/23 1700 08/16/23 1759  BP:  (!) 101/59 106/68 (!) 110/53  Pulse:  73 69 75  Resp:  15 14 14   Temp:    98.6 F (37 C)  TempSrc:    Oral  SpO2:  100% 100% 98%  Weight: 103.4 kg     Height: 5\' 10"  (1.778 m)     PainSc: 0-No pain  0-No pain     Isolation Precautions No active isolations  Medications Medications  0.9 %  sodium chloride infusion (Manually program via Guardrails IV Fluids) (has no administration in  time range)  lactated ringers bolus 1,000 mL (has no administration in time range)  insulin aspart (novoLOG) injection 0-9 Units (has no administration in time range)  atorvastatin (LIPITOR) tablet 10 mg (has no administration in time range)  insulin glargine-yfgn (SEMGLEE) injection 5 Units (has no administration in time range)  cefTRIAXone (ROCEPHIN) 1 g in sodium chloride 0.9 % 100 mL IVPB (0 g Intravenous Stopped 08/16/23 1644)  sodium chloride 0.9 % bolus 500 mL (0 mLs Intravenous Stopped 08/16/23 1738)    Mobility walks     Focused Assessments Neuro/muskuloskeletal   R Recommendations: See Admitting Provider Note  Report given to:   Additional Notes:

## 2023-08-16 NOTE — ED Provider Notes (Signed)
[place] EMERGENCY DEPARTMENT AT [facility] Provider Note  CSN: [id number] Arrival date & time: 08/16/23 1427  Chief Complaint(s) Weakness  HPI Harold MACLEOD Sr. is a 72 y.o. male here today with generalized weakness.  Patient had a prostate biopsy 1 week ago, says that he had a large amount of blood in his stool for few days, but depends, and over the last couple of days it has stopped.  He is not on blood thinners.   Past Medical History Past Medical History:  Diagnosis Date   Back pain    Cataract    Diabetes mellitus without complication (HCC)    Hyperlipidemia    Hypertension    Patient Active Problem List   Diagnosis Date Noted   Intractable nausea and vomiting 07/21/2022   Diabetes mellitus, type II, insulin dependent (HCC) 07/14/2018   Diabetic hyperosmolar non-ketotic state (HCC) 07/22/2017   Hypertension 07/20/2017   Infected blister 05/19/2017   Sensorineural hearing loss (SNHL), bilateral 11/07/2016   Tinnitus, bilateral 11/07/2016   Home Medication(s) Prior to Admission medications   Medication Sig Start Date End Date Taking? Authorizing Provider  amLODipine (NORVASC) 10 MG tablet Take 10 mg by mouth daily.    [provider]  blood glucose meter kit and supplies Dispense based on patient and insurance preference. Use up to four times daily as directed. (FOR ICD-9 250.00, 250.01). 07/22/17   Zannie Cove, MD  Cholecalciferol (VITAMIN D3) 3000 UNITS TABS Take 5,000 Units by mouth daily.    [provider]  cloNIDine (CATAPRES) 0.1 MG tablet Take 0.1 mg by mouth at bedtime.    [provider]  insulin glargine (LANTUS) 100 UNIT/ML injection Inject 0.25 mLs (25 Units total) into the skin daily. Patient taking differently: Inject 10 Units into the skin daily.  In AM 07/23/17   Zannie Cove, MD  LORazepam (ATIVAN) 0.5 MG tablet Take 1 tablet (0.5 mg total) by mouth every 6 (six) hours as needed (Dizziness). Patient  not taking: Reported on 03/07/2023 01/30/21   Mancel Bale, MD  meclizine (ANTIVERT) 25 MG tablet Take by mouth. As needed 10/08/22   [provider]  metFORMIN (GLUCOPHAGE) 500 MG tablet Take 1 tablet (500 mg total) by mouth 2 (two) times daily with a meal. 07/22/17   Zannie Cove, MD  metoCLOPramide (REGLAN) 10 MG tablet Take 1 tablet (10 mg total) by mouth every 6 (six) hours. Patient not taking: Reported on 03/07/2023 07/21/22   Michelle Piper, PA-C  Multiple Vitamins-Minerals (MENS MULTIVITAMIN PLUS PO) Take by mouth daily.    [provider]  Omega-3 Fatty Acids (FISH OIL) 1360 MG CAPS Take by mouth.    [provider]  Omega-3 Fatty Acids (OMEGA 3 PO) Take by mouth daily.    [provider]  OVER THE COUNTER MEDICATION Take 10 mg by mouth daily at 6 (six) AM. Atrovastatin    [provider]  tamsulosin (FLOMAX) 0.4 MG CAPS capsule Take 0.4 mg by mouth daily. 12/24/22   [provider]  valsartan-hydrochlorothiazide (DIOVAN-HCT) 320-25 MG tablet Take 1 tablet by mouth daily.    [provider]  Past Surgical History Past Surgical History:  Procedure Laterality Date   COLONOSCOPY  09/13/2014   COLONOSCOPY  01/12/2020   EYE SURGERY     KNEE SURGERY     left knee   MINOR CARPAL TUNNEL     POLYPECTOMY     TONSILLECTOMY     Family History Family History  Problem Relation Age of Onset   Dementia Mother    Colon cancer Paternal Grandmother    Colon polyps Neg Hx    Esophageal cancer Neg Hx    Rectal cancer Neg Hx    Stomach cancer Neg Hx     Social History Social History   Tobacco Use   Smoking status: Former    Current packs/day: 0.00    Types: Cigarettes    Quit date: 08/31/1983    Years since quitting: 39.9   Smokeless tobacco: Never  Vaping Use   Vaping status: Never Used   Substance Use Topics   Alcohol use: No    Alcohol/week: 0.0 standard drinks of alcohol   Drug use: No   Allergies Patient has no known allergies.  Review of Systems Review of Systems  Physical Exam Vital Signs  I have reviewed the triage vital signs BP (!) 101/59   Pulse 73   Temp 98.7 F (37.1 C)   Resp 15   Ht 5\' 10"  (1.778 m)   Wt 103.4 kg   SpO2 100%   BMI 32.71 kg/m   Physical Exam Vitals reviewed.  Cardiovascular:     Rate and Rhythm: Normal rate.  Pulmonary:     Effort: Pulmonary effort is normal.  Abdominal:     General: Abdomen is flat.     Palpations: Abdomen is soft.  Genitourinary:    Prostate: Normal.     Rectum: Normal. Guaiac result negative.  Neurological:     Mental Status: He is alert.     ED Results and Treatments Labs (all labs ordered are listed, but only abnormal results are displayed) Labs Reviewed  BASIC METABOLIC PANEL - Abnormal; Notable for the following components:      Result Value   Potassium 3.2 (*)    BUN 41 (*)    Creatinine, Ser 2.12 (*)    GFR, Estimated 32 (*)    All other components within normal limits  CBC - Abnormal; Notable for the following components:   WBC 15.8 (*)    RBC 2.82 (*)    Hemoglobin 9.3 (*)    HCT 28.0 (*)    All other components within normal limits  URINALYSIS, ROUTINE W REFLEX MICROSCOPIC - Abnormal; Notable for the following components:   Hgb urine dipstick SMALL (*)    Leukocytes,Ua TRACE (*)    Bacteria, UA RARE (*)    All other components within normal limits  CULTURE, BLOOD (ROUTINE X 2)  CULTURE, BLOOD (ROUTINE X 2)  LACTIC ACID, PLASMA  LACTIC ACID, PLASMA  TYPE AND SCREEN  ABO/RH  Radiology No results found.  Pertinent labs & imaging results that were available during my care of the patient were reviewed by me and considered in my medical decision making  (see MDM for details).  Medications Ordered in ED Medications  cefTRIAXone (ROCEPHIN) 1 g in sodium chloride 0.9 % 100 mL IVPB (1 g Intravenous New Bag/Given 08/16/23 1614)  0.9 %  sodium chloride infusion (Manually program via Guardrails IV Fluids) (has no administration in time range)  sodium chloride 0.9 % bolus 500 mL (500 mLs Intravenous New Bag/Given 08/16/23 1604)                                                                                                                                     Procedures .Critical Care  Performed by: Arletha Pili, DO Authorized by: Arletha Pili, DO   Critical care provider statement:    Critical care time (minutes):  30   Critical care was necessary to treat or prevent imminent or life-threatening deterioration of the following conditions:  Shock   Critical care was time spent personally by me on the following activities:  Development of treatment plan with patient or surrogate, discussions with consultants, evaluation of patient's response to treatment, examination of patient, ordering and review of laboratory studies, ordering and review of radiographic studies, ordering and performing treatments and interventions, pulse oximetry, re-evaluation of patient's condition and review of old charts   Care discussed with: admitting provider     (including critical care time)  Medical Decision Making / ED Course   This patient presents to the ED for concern of lower GI bleed, this involves an extensive number of treatment options, and is a complaint that carries with it a high risk of complications and morbidity.  The differential diagnosis includes lower GI bleed, postoperative bleeding, sepsis, acute kidney injury, less likely CVA.  MDM: 72 year old male here today for generalized weakness, lightheadedness.  Patient has had a 7 point hemoglobin drop from his baseline.  He also has an acute kidney injury.  Did perform digital rectal exam which  did not reveal any goes of gross blood.  Patient says that it has not been bleeding for the last couple of days.  Bleeding is light resolved, however the patient lost enough blood for him to be very lightheaded.  Will transfuse the patient.  Considered imaging of the patient's abdomen and pelvis, however in the absence of gross bleeding, likely not indicated, and we know the location of the bleeding.  I did not appreciate any significant tenderness on the prostate.  Patient still pending urinalysis.  Out of an abundance of caution, have ordered blood cultures and lactic acid on the patient.  Have given a dose of Rocephin.  I do not believe patient has sepsis at this time.  Will admit patient to hospitalist.   Additional history obtained: -Additional history obtained from urgent care note. -External records from  outside source obtained and reviewed including: Chart review including previous notes, labs, imaging, consultation notes   Lab Tests: -I ordered, reviewed, and interpreted labs.   The pertinent results include:   Labs Reviewed  BASIC METABOLIC PANEL - Abnormal; Notable for the following components:      Result Value   Potassium 3.2 (*)    BUN 41 (*)    Creatinine, Ser 2.12 (*)    GFR, Estimated 32 (*)    All other components within normal limits  CBC - Abnormal; Notable for the following components:   WBC 15.8 (*)    RBC 2.82 (*)    Hemoglobin 9.3 (*)    HCT 28.0 (*)    All other components within normal limits  URINALYSIS, ROUTINE W REFLEX MICROSCOPIC - Abnormal; Notable for the following components:   Hgb urine dipstick SMALL (*)    Leukocytes,Ua TRACE (*)    Bacteria, UA RARE (*)    All other components within normal limits  CULTURE, BLOOD (ROUTINE X 2)  CULTURE, BLOOD (ROUTINE X 2)  LACTIC ACID, PLASMA  LACTIC ACID, PLASMA  TYPE AND SCREEN  ABO/RH      EKG   EKG Interpretation Date/Time:    Ventricular Rate:    PR Interval:    QRS Duration:    QT  Interval:    QTC Calculation:   R Axis:      Text Interpretation:           Imaging Studies ordered: Normal sinus rhythm I independently visualized and interpreted imaging. I agree with the radiologist interpretation   Medicines ordered and prescription drug management: Meds ordered this encounter  Medications   cefTRIAXone (ROCEPHIN) 1 g in sodium chloride 0.9 % 100 mL IVPB    Order Specific Question:   Antibiotic Indication:    Answer:   UTI   sodium chloride 0.9 % bolus 500 mL   0.9 %  sodium chloride infusion (Manually program via Guardrails IV Fluids)    -I have reviewed the patients home medicines and have made adjustments as needed  Critical interventions Blood transfusion  Consultations Obtained: I requested consultation with the hospitalist,  and discussed lab and imaging findings as well as pertinent plan - they recommend: Admission.   Cardiac Monitoring: The patient was maintained on a cardiac monitor.  I personally viewed and interpreted the cardiac monitored which showed an underlying rhythm of: Sinus rhythm  Social Determinants of Health:  Factors impacting patients care include:    Reevaluation: After the interventions noted above, I reevaluated the patient and found that they have :improved  Co morbidities that complicate the patient evaluation  Past Medical History:  Diagnosis Date   Back pain    Cataract    Diabetes mellitus without complication (HCC)    Hyperlipidemia    Hypertension       Dispostion: Admission.     Final Clinical Impression(s) / ED Diagnoses Final diagnoses:  None     @PCDICTATION @    Anders Simmonds T, DO 08/16/23 1631

## 2023-08-17 ENCOUNTER — Inpatient Hospital Stay (HOSPITAL_COMMUNITY): Payer: Medicare Other

## 2023-08-17 DIAGNOSIS — E876 Hypokalemia: Secondary | ICD-10-CM

## 2023-08-17 DIAGNOSIS — W19XXXA Unspecified fall, initial encounter: Secondary | ICD-10-CM | POA: Insufficient documentation

## 2023-08-17 DIAGNOSIS — E119 Type 2 diabetes mellitus without complications: Secondary | ICD-10-CM

## 2023-08-17 DIAGNOSIS — I959 Hypotension, unspecified: Secondary | ICD-10-CM | POA: Insufficient documentation

## 2023-08-17 DIAGNOSIS — R42 Dizziness and giddiness: Secondary | ICD-10-CM

## 2023-08-17 DIAGNOSIS — D62 Acute posthemorrhagic anemia: Secondary | ICD-10-CM | POA: Diagnosis not present

## 2023-08-17 DIAGNOSIS — Z794 Long term (current) use of insulin: Secondary | ICD-10-CM

## 2023-08-17 DIAGNOSIS — N179 Acute kidney failure, unspecified: Secondary | ICD-10-CM | POA: Diagnosis present

## 2023-08-17 LAB — HEMOGLOBIN A1C
Hgb A1c MFr Bld: 4.9 % (ref 4.8–5.6)
Mean Plasma Glucose: 93.93 mg/dL

## 2023-08-17 LAB — CBC
HCT: 29.1 % — ABNORMAL LOW (ref 39.0–52.0)
Hemoglobin: 9.9 g/dL — ABNORMAL LOW (ref 13.0–17.0)
MCH: 32.7 pg (ref 26.0–34.0)
MCHC: 34 g/dL (ref 30.0–36.0)
MCV: 96 fL (ref 80.0–100.0)
Platelets: 258 10*3/uL (ref 150–400)
RBC: 3.03 MIL/uL — ABNORMAL LOW (ref 4.22–5.81)
RDW: 14.6 % (ref 11.5–15.5)
WBC: 13 10*3/uL — ABNORMAL HIGH (ref 4.0–10.5)
nRBC: 0 % (ref 0.0–0.2)

## 2023-08-17 LAB — TYPE AND SCREEN
ABO/RH(D): B POS
Antibody Screen: NEGATIVE
Unit division: 0

## 2023-08-17 LAB — BASIC METABOLIC PANEL
Anion gap: 11 (ref 5–15)
BUN: 27 mg/dL — ABNORMAL HIGH (ref 8–23)
CO2: 23 mmol/L (ref 22–32)
Calcium: 9.4 mg/dL (ref 8.9–10.3)
Chloride: 101 mmol/L (ref 98–111)
Creatinine, Ser: 1.36 mg/dL — ABNORMAL HIGH (ref 0.61–1.24)
GFR, Estimated: 55 mL/min — ABNORMAL LOW (ref >60)
Glucose, Bld: 91 mg/dL (ref 70–99)
Potassium: 3.4 mmol/L — ABNORMAL LOW (ref 3.5–5.1)
Sodium: 135 mmol/L (ref 135–145)

## 2023-08-17 LAB — GLUCOSE, CAPILLARY
Glucose-Capillary: 88 mg/dL (ref 70–99)
Glucose-Capillary: 101 mg/dL — ABNORMAL HIGH (ref 70–99)
Glucose-Capillary: 120 mg/dL — ABNORMAL HIGH (ref 70–99)

## 2023-08-17 LAB — BPAM RBC
Blood Product Expiration Date: 202411202359
ISSUE DATE / TIME: 202411021746
Unit Type and Rh: 7300

## 2023-08-17 LAB — TSH: TSH: 1.281 u[IU]/mL (ref 0.350–4.500)

## 2023-08-17 LAB — TROPONIN I (HIGH SENSITIVITY)
Troponin I (High Sensitivity): 26 ng/L — ABNORMAL HIGH (ref <18)
Troponin I (High Sensitivity): 29 ng/L — ABNORMAL HIGH (ref <18)

## 2023-08-17 LAB — HEMOGLOBIN AND HEMATOCRIT, BLOOD
HCT: 28.5 % — ABNORMAL LOW (ref 39.0–52.0)
Hemoglobin: 10 g/dL — ABNORMAL LOW (ref 13.0–17.0)

## 2023-08-17 LAB — MAGNESIUM: Magnesium: 2.1 mg/dL (ref 1.7–2.4)

## 2023-08-17 MED ORDER — ONDANSETRON HCL 4 MG/2ML IJ SOLN
4.0000 mg | Freq: Four times a day (QID) | INTRAMUSCULAR | Status: DC
  Filled 2023-08-17: qty 2

## 2023-08-17 MED ORDER — IOHEXOL 350 MG/ML SOLN
100.0000 mL | Freq: Once | INTRAVENOUS | Status: AC | PRN
  Administered 2023-08-17: 100 mL via INTRAVENOUS

## 2023-08-17 MED ORDER — PROCHLORPERAZINE MALEATE 5 MG PO TABS: 5.0000 mg | ORAL_TABLET | Freq: Four times a day (QID) | ORAL | Status: DC | PRN

## 2023-08-17 MED ORDER — POTASSIUM CHLORIDE CRYS ER 20 MEQ PO TBCR
40.0000 meq | EXTENDED_RELEASE_TABLET | Freq: Once | ORAL | Status: AC
  Administered 2023-08-17: 40 meq via ORAL
  Filled 2023-08-17: qty 2

## 2023-08-17 NOTE — Assessment & Plan Note (Signed)
K slightly low at 3.4 which was repleted -A.m. BMP -will check mag today

## 2023-08-17 NOTE — Consult Note (Signed)
Urology Consult Note   Requesting Attending Physician:  McDiarmid, Leighton Roach, MD Service Providing Consult: Urology  Consulting Attending: Di Kindle  Reason for Consult:  concern for blood loss anemia s/p prostate biopsy  HPI: Harold CULLENS Sr. is seen in consultation for reasons noted above at the request of McDiarmid, Leighton Roach, MD   This is a 72 y.o. male with past medical history significant for diabetes, hyperlipidemia, hypertension, and recent prostate biopsy for an elevated PSA on 08/12/2023.  Patient states that when he went home that day, he experienced a large amount of hematochezia, requiring over 13 diapers over the postoperative 24-hour course.  He even states that some of the clots were the size of "a clenched fist."  Since his biopsy, he states that he has felt weak with low energy, and subjective feelings of dizziness, lethargy, and poor mental status.  He reports possible subjective fevers, but did not take his temperature.  He did have very mild hematuria that lasted 1 to 2 days, but is since resolved.  Since Wednesday, he has had no rectal bleeding.  He presented to the hospital given that he continued to feel poor.  He states that he has not eaten in 2 or 3 days.  Lab work in the ED was significant for an acute drop in hemoglobin to 9.3.  It appears that in October 2023 1 year ago, his hemoglobin was 16.2.  He was resuscitated with 1 unit of RBC's with resulting hemoglobin 9.9.  His hemoglobin remained stable today.  A CT scan of his abdomen pelvis was conducted earlier today with final read pending to rule out any other potential sources of bleeding.   Past Medical History: Past Medical History:  Diagnosis Date   Back pain    Cataract    Diabetes mellitus without complication (HCC)    Hyperlipidemia    Hypertension     Past Surgical History:  Past Surgical History:  Procedure Laterality Date   COLONOSCOPY  09/13/2014   COLONOSCOPY  01/12/2020    EYE SURGERY     KNEE SURGERY     left knee   MINOR CARPAL TUNNEL     POLYPECTOMY     TONSILLECTOMY      Medication: Current Facility-Administered Medications  Medication Dose Route Frequency Provider Last Rate Last Admin   0.9 %  sodium chloride infusion (Manually program via Guardrails IV Fluids)   Intravenous Once [name], MD       atorvastatin (LIPITOR) tablet 10 mg  10 mg Oral Daily [name], MD   10 mg at 08/17/23 0904   insulin aspart (novoLOG) injection 0-9 Units  0-9 Units Subcutaneous TID WC [name], MD       insulin glargine-yfgn [facility]) injection 5 Units  5 Units Subcutaneous Daily [name], MD   5 Units at 08/17/23 1610   prochlorperazine (COMPAZINE) tablet 5 mg  5 mg Oral Q6H PRN Erick Alley, DO        Allergies: No Known Allergies  Social History: Social History   Tobacco Use   Smoking status: Former    Current packs/day: 0.00    Types: Cigarettes    Quit date: 08/31/1983    Years since quitting: 39.9   Smokeless tobacco: Never  Vaping Use   Vaping status: Never Used  Substance Use Topics   Alcohol use: No    Alcohol/week: 0.0 standard drinks of alcohol   Drug use: No    Family History Family History  Problem Relation Age of Onset   Dementia Mother    Colon cancer Paternal Grandmother    Colon polyps Neg Hx    Esophageal cancer Neg Hx    Rectal cancer Neg Hx    Stomach cancer Neg Hx     Review of Systems 10 systems were reviewed and are negative except as noted specifically in the HPI.  Objective   Vital signs in last 24 hours: BP 117/67 (BP Location: Left Arm)   Pulse 70   Temp 98.3 F (36.8 C) (Oral)   Resp 16   Ht 5\' 10"  (1.778 m)   Wt 103.4 kg   SpO2 99%   BMI 32.71 kg/m   Physical Exam General: NAD, A&O, resting, appropriate HEENT: [place]/AT, EOMI, MMM Pulmonary: Normal work of breathing Cardiovascular: HDS, adequate peripheral perfusion Abdomen: Soft, NTTP, nondistended. GU: VS, No CVA  tenderness Extremities: warm and well perfused Neuro: Appropriate, no focal neurological deficits  Most Recent Labs: Lab Results  Component Value Date   WBC 13.0 (H) 08/17/2023   HGB 10.0 (L) 08/17/2023   HCT 28.5 (L) 08/17/2023   PLT 258 08/17/2023    Lab Results  Component Value Date   NA 135 08/17/2023   K 3.4 (L) 08/17/2023   CL 101 08/17/2023   CO2 23 08/17/2023   BUN 27 (H) 08/17/2023   CREATININE 1.36 (H) 08/17/2023   CALCIUM 9.4 08/17/2023    No results found for: "INR", "APTT"   Urine Culture: @[id number] (laburin,org,[id number])@   IMAGING: CT Head Wo Contrast  Result Date: 08/16/2023 CLINICAL DATA:  Ataxia, head trauma EXAM: CT HEAD WITHOUT CONTRAST TECHNIQUE: Contiguous axial images were obtained from the base of the skull through the vertex without intravenous contrast. RADIATION DOSE REDUCTION: This exam was performed according to the departmental dose-optimization program which includes automated exposure control, adjustment of the mA and/or kV according to patient size and/or use of iterative reconstruction technique. COMPARISON:  Brain MRI 01/30/2021 FINDINGS: Brain: There is no acute intracranial hemorrhage, extra-axial fluid collection, or acute infarct. Parenchymal volume is within expected limits for age. The ventricles are normal in size. Gray-white differentiation is preserved. The pituitary and suprasellar region are normal. There is no mass lesion. There is no mass effect or midline shift. Vascular: No hyperdense vessel or unexpected calcification. Skull: Normal. Negative for fracture or focal lesion. Sinuses/Orbits: The paranasal sinuses are clear. Bilateral lens implants are in place. The globes and orbits are otherwise unremarkable. Other: The mastoid air cells and middle ear cavities are clear. IMPRESSION: No acute intracranial pathology. Electronically Signed   By: Lesia Hausen M.D.   On: 08/16/2023 17:57    ------  Assessment:  72 y.o. male with  past medical history of recent prostate biopsy currently admitted after coming to the emergency department and found t to be acutely symptomatic with low hemoglobin with concern for bleeding after prostate biopsy.  CTA of abdomen and pelvis performed, final read currently pending.  Available records show the patient had hemoglobin of 16.2 back in 2023, unclear if patient's hemoglobin was this high directly before his biopsy.   Reassuringly, patient had significant bleeding for 1 day after the biopsy, but has not had any bleeding since Wednesday.  Based on chart review, it appears that patient ended up receiving 1 unit of red blood cells with moderate response, and hemoglobin remained stable today.  If CT scan shows any concerning signs for acute bleeding or patient continues to have dropping hemoglobin, would involve GI for further  evaluation.   Recommendations: - No urologic intervention indicated at this time - Follow-up final read CT A/P - IF ongoing concern for blood loss, can consider GI consult - Urology will follow along PRN    Thank you for this consult. Please contact the urology consult pager with any further questions/concerns.

## 2023-08-17 NOTE — Assessment & Plan Note (Signed)
Home regimen metformin 1000 mg twice daily and Lantus 10 units daily.  A1c 4.9.  Glucose 88 this morning. -Lantus 5 units daily -Holding metformin -SSI with CBGs with meals and bedtime

## 2023-08-17 NOTE — Plan of Care (Signed)
  Problem: Education: Goal: Ability to describe self-care measures that may prevent or decrease complications (Diabetes Survival Skills Education) will improve Outcome: Progressing   Problem: Coping: Goal: Ability to adjust to condition or change in health will improve Outcome: Progressing   Problem: Fluid Volume: Goal: Ability to maintain a balanced intake and output will improve Outcome: Progressing   Problem: Health Behavior/Discharge Planning: Goal: Ability to manage health-related needs will improve Outcome: Progressing   Problem: Metabolic: Goal: Ability to maintain appropriate glucose levels will improve Outcome: Progressing   Problem: Nutritional: Goal: Progress toward achieving an optimal weight will improve Outcome: Progressing

## 2023-08-17 NOTE — Progress Notes (Signed)
Daily Progress Note Intern Pager: [phone]  Patient name: Harold [name]. Medical record number: [id number] Date of birth: [birth year] Age: 72 y.o. Gender: male  Primary Care Provider: Renaye Rakers, MD Consultants: Urology Code Status: Full  Pt Overview and Major Events to Date:  11/2-admitted  Assessment and Plan: Harold ABERNATHY Sr. is a 72 y.o. male presenting with symptomatic anemia following episode of hematochezia following prostate biopsy.  PMH significant for T2DM, HTN, HLD Assessment & Plan Acute blood loss anemia Not currently bleeding.  S/p 2 units RBCs due to symptomatic anemia.  Hemoglobin 9.3> 9.9.  Patient still symptomatic this morning with stable vitals.  I am concerned for internal bleeding as hemoglobin did not increase significantly after 2 units.  Spoke with urologist Dr. Jennette Dubin on the phone who stated patients can have blood in stool, urine, and semen for a few weeks after prostate biopsy but fulminant amount of rectal bleeding is very unexpected and suggested looking for other causes. Stat H&H as pt still symptomatic  CTA abdomen pelvis ordered  Holding home blood pressure meds F/u blood cultures  Diabetes mellitus, type II, insulin dependent (HCC) Home regimen metformin 1000 mg twice daily and Lantus 10 units daily.  A1c 4.9.  Glucose 88 this morning. -Lantus 5 units daily -Holding metformin -SSI with CBGs with meals and bedtime AKI (acute kidney injury) (HCC) Baseline creatinine ~ 1. Cr trending down (2.12>1.36)  s/p fluid bolus and 2 units RBCs. -A.m. BMP Light headedness Thought to be related to blood loss anemia.  Also need to consider cardiac causes.  On auscultation patient had a irregular rhythm which is most likely consistent with frequent PVCs/PACs seen on telemetry and previous EKGs which could cause lightheadedness.  -Repeat EKG -Troponin  Hypokalemia K slightly low at 3.4 which was repleted -A.m. BMP -will check mag  today   Chronic and Stable Issues: HTN-holding medications due to soft pressures in setting of bleed HLD-continue Lipitor  FEN/GI: Normal diet PPx: SCDs Dispo: Home pending continued medical management  Subjective:  Patient states he is still feeling lightheaded this morning, denies any other symptoms, denies any more bleeding from rectum.  Denies abdominal pain.  Objective: Temp:  [98.1 F (36.7 C)-98.9 F (37.2 C)] 98.3 F (36.8 C) (11/03 0809) Pulse Rate:  [69-78] 70 (11/03 0809) Resp:  [14-22] 16 (11/03 0809) BP: (91-125)/(50-73) 117/67 (11/03 0809) SpO2:  [96 %-100 %] 99 % (11/03 0809) Weight:  [103.4 kg] 103.4 kg (11/02 1439) Physical Exam: General: Alert, pleasant, NAD Cardiovascular: Regular rate, irregular rhythm-PVCs noted on telemetry Respiratory: Breathing comfortably on room air Abdomen: Bowel sounds present, soft, nontender palpation, nondistended Extremities: No edema BLEs  Laboratory: Most recent CBC Lab Results  Component Value Date   WBC 13.0 (H) 08/17/2023   HGB 9.9 (L) 08/17/2023   HCT 29.1 (L) 08/17/2023   MCV 96.0 08/17/2023   PLT 258 08/17/2023   Most recent BMP    Latest Ref Rng & Units 08/17/2023    3:45 AM  BMP  Glucose 70 - 99 mg/dL 91   BUN 8 - 23 mg/dL 27   Creatinine 1.47 - 1.24 mg/dL 8.29   Sodium 562 - 130 mmol/L 135   Potassium 3.5 - 5.1 mmol/L 3.4   Chloride 98 - 111 mmol/L 101   CO2 22 - 32 mmol/L 23   Calcium 8.9 - 10.3 mg/dL 9.4      Erick Alley, DO 08/17/2023, 10:11 AM  PGY-3, [facility]  FPTS Intern pager: [phone], text pages welcome Secure chat group [facility] Teaching Service

## 2023-08-17 NOTE — Assessment & Plan Note (Signed)
Thought to be related to blood loss anemia.  Also need to consider cardiac causes.  On auscultation patient had a irregular rhythm which is most likely consistent with frequent PVCs/PACs seen on telemetry and previous EKGs which could cause lightheadedness.  -Repeat EKG -Troponin

## 2023-08-17 NOTE — Assessment & Plan Note (Addendum)
Not currently bleeding.  S/p 2 units RBCs due to symptomatic anemia.  Hemoglobin 9.3> 9.9.  Patient still symptomatic this morning with stable vitals.  I am concerned for internal bleeding as hemoglobin did not increase significantly after 2 units.  Spoke with urologist Dr. Jennette Dubin on the phone who stated patients can have blood in stool, urine, and semen for a few weeks after prostate biopsy but fulminant amount of rectal bleeding is very unexpected and suggested looking for other causes. Stat H&H as pt still symptomatic  CTA abdomen pelvis ordered  Holding home blood pressure meds F/u blood cultures

## 2023-08-17 NOTE — Assessment & Plan Note (Addendum)
Baseline creatinine ~ 1. Cr trending down (2.12>1.36)  s/p fluid bolus and 2 units RBCs. -A.m. BMP

## 2023-08-18 ENCOUNTER — Inpatient Hospital Stay (HOSPITAL_COMMUNITY): Payer: Medicare Other

## 2023-08-18 DIAGNOSIS — R55 Syncope and collapse: Secondary | ICD-10-CM | POA: Diagnosis not present

## 2023-08-18 DIAGNOSIS — I493 Ventricular premature depolarization: Secondary | ICD-10-CM | POA: Diagnosis not present

## 2023-08-18 DIAGNOSIS — N179 Acute kidney failure, unspecified: Secondary | ICD-10-CM | POA: Diagnosis not present

## 2023-08-18 DIAGNOSIS — R42 Dizziness and giddiness: Secondary | ICD-10-CM | POA: Diagnosis not present

## 2023-08-18 DIAGNOSIS — D62 Acute posthemorrhagic anemia: Secondary | ICD-10-CM | POA: Diagnosis not present

## 2023-08-18 LAB — ECHOCARDIOGRAM COMPLETE
Area-P 1/2: 3.74 cm2
Calc EF: 68.8 %
Height: 70 in
S' Lateral: 2.8 cm
Single Plane A2C EF: 72.2 %
Single Plane A4C EF: 64 %
Weight: 3648 [oz_av]

## 2023-08-18 LAB — BASIC METABOLIC PANEL
Anion gap: 9 (ref 5–15)
BUN: 17 mg/dL (ref 8–23)
CO2: 25 mmol/L (ref 22–32)
Calcium: 9.6 mg/dL (ref 8.9–10.3)
Chloride: 104 mmol/L (ref 98–111)
Creatinine, Ser: 1.18 mg/dL (ref 0.61–1.24)
GFR, Estimated: >60 mL/min (ref >60)
Glucose, Bld: 128 mg/dL — ABNORMAL HIGH (ref 70–99)
Potassium: 3.1 mmol/L — ABNORMAL LOW (ref 3.5–5.1)
Sodium: 138 mmol/L (ref 135–145)

## 2023-08-18 LAB — CBC
HCT: 28.5 % — ABNORMAL LOW (ref 39.0–52.0)
Hemoglobin: 9.7 g/dL — ABNORMAL LOW (ref 13.0–17.0)
MCH: 31.9 pg (ref 26.0–34.0)
MCHC: 34 g/dL (ref 30.0–36.0)
MCV: 93.8 fL (ref 80.0–100.0)
Platelets: 270 10*3/uL (ref 150–400)
RBC: 3.04 MIL/uL — ABNORMAL LOW (ref 4.22–5.81)
RDW: 14.2 % (ref 11.5–15.5)
WBC: 10.2 10*3/uL (ref 4.0–10.5)
nRBC: 0 % (ref 0.0–0.2)

## 2023-08-18 LAB — GLUCOSE, CAPILLARY
Glucose-Capillary: 102 mg/dL — ABNORMAL HIGH (ref 70–99)
Glucose-Capillary: 107 mg/dL — ABNORMAL HIGH (ref 70–99)
Glucose-Capillary: 113 mg/dL — ABNORMAL HIGH (ref 70–99)
Glucose-Capillary: 144 mg/dL — ABNORMAL HIGH (ref 70–99)
Glucose-Capillary: 159 mg/dL — ABNORMAL HIGH (ref 70–99)

## 2023-08-18 MED ORDER — POTASSIUM CHLORIDE 20 MEQ PO PACK
40.0000 meq | PACK | Freq: Once | ORAL | Status: AC
  Administered 2023-08-18: 40 meq via ORAL
  Filled 2023-08-18: qty 2

## 2023-08-18 MED ORDER — POTASSIUM CHLORIDE CRYS ER 20 MEQ PO TBCR
60.0000 meq | EXTENDED_RELEASE_TABLET | Freq: Once | ORAL | Status: AC
  Administered 2023-08-18: 60 meq via ORAL
  Filled 2023-08-18: qty 3

## 2023-08-18 NOTE — Progress Notes (Signed)
  Echocardiogram 2D Echocardiogram has been performed.  Harold [name] 08/18/2023, 2:11 PM

## 2023-08-18 NOTE — Assessment & Plan Note (Addendum)
Home regimen metformin 1000 mg twice daily and Lantus 10 units daily.  A1c 4.9.  Glucose 88 this morning.  Will stop long-acting insulin.  -Stopping long-acting insulin -Holding metformin -SSI with CBGs with meals and bedtime

## 2023-08-18 NOTE — Plan of Care (Signed)
  Problem: Coping: Goal: Ability to adjust to condition or change in health will improve Outcome: Progressing   Problem: Education: Goal: Knowledge of General Education information will improve Description: Including pain rating scale, medication(s)/side effects and non-pharmacologic comfort measures Outcome: Progressing   Problem: Pain Management: Goal: General experience of comfort will improve Outcome: Progressing   Problem: Safety: Goal: Ability to remain free from injury will improve Outcome: Progressing   Problem: Skin Integrity: Goal: Risk for impaired skin integrity will decrease Outcome: Progressing

## 2023-08-18 NOTE — Progress Notes (Signed)
  Echocardiogram 2D Echocardiogram has been performed.  Janalyn Harder 08/18/2023, 2:12 PM

## 2023-08-18 NOTE — Hospital Course (Addendum)
Harold Almond Sr. is a 72 y.o.male with a history of T2DM, HTN, HLD who was admitted to the [facility] Medicine Teaching Service at [facility] for symptomatic anemia. His hospital course is detailed below:  Acute blood loss anemia Presented with fatigue, palpitations, and a fall without LOC or head injury. Had prostate biopsy on 10/29 with significant rectal bleeding right after biopsy for 12 hours and resolved since. Hgb was 9.3 on admission (down from 16), got 2 u pRBCs and IVF bolus in the ED. CT abdomen/pelvis showed no other source of bleeding. Urology was consulted and did not recommend intervention.  No bleeding since episode.  Lightheadedness patient reports lightheadedness which began around time the blood loss episode.  Worsened by standing up but negative orthostatic vitals.  Ruled down cardiogenic causes with negative echo and telemetry which showed less than 10% burden of PVCs.  Cardiology recommended avoiding beta-blockers and recommended considering spironolactone with continued PVC burden.  PT evaluated patient and was able to ambulate without issue.  Patient was recommended to take his time when getting up and to use his spouses walker when he rushes to use the restroom.  Blood pressure medications held her hospitalization and at discharge be restarted by PCP recommendation.  Diabetes mellitus, type II A1c 4.9.  Glucose level of 88 one morning.  D/c  insulin at discharge, continued on metformin.   Other chronic conditions were medically managed with home medications and formulary alternatives as necessary (hypertension, hyperlipidemia)  PCP Follow-up Recommendations: 3-4 week XR PA and lateral for lingula opacity incidentally found on CT Avoid Beta blockers per Cardiology recommendations Ensure Urology follow-up Consider outpatient Cardiology follow-up Restart blood pressure medications as tolerated

## 2023-08-18 NOTE — Assessment & Plan Note (Signed)
Baseline around 1.0.  Currently 2.1.  Likely secondary to ATN and hypovolemia in the setting of acute blood loss. Plan to monitor creatinine with fluid resucitation AM BMP Hold metformin 1L LR Bolus

## 2023-08-18 NOTE — Assessment & Plan Note (Signed)
3.1.  Mag 2.1 - Repleted potassium chloride 60 mEq  -Recheck BMP tomorrow

## 2023-08-18 NOTE — Assessment & Plan Note (Addendum)
Baseline creatinine ~ 1. Cr trending down (2.12>1.36->1.18)  s/p fluid bolus and 2 units RBCs. -A.m. BMP

## 2023-08-18 NOTE — Consult Note (Signed)
Cardiology Consultation   Patient ID: Harold STAR Sr. MRN: [id number]; DOB: 1951-08-16  Admit date: 08/16/2023 Date of Consult: 08/18/2023  PCP:  Renaye Rakers, MD   [facility] Providers Cardiologist:  None     Patient Profile:   Harold Almond Sr. is a 72 y.o. male with a hx of DM, HTN, HLD who is being seen 08/18/2023 for the evaluation of dizziness/PVCs at the request of Dr. McDiarmid.  History of Present Illness:   Harold [name] is a 72 year old male with past medical history noted above.  Reports he was seen remotely by cardiology over 20 years ago.  He presented on 11/2 with symptomatic anemia following an episode of hematochezia after a prostate biopsy.  Reported having had a prostate biopsy 1 week prior and had residual large amounts of blood in his stool for about 36 hrs afterwards. Felt weak, lightheaded and dizziness. Fell at his home on 11/2 prompting visit to [facility] and then directed to the ED.  In the ED his labs on admission showed sodium 136, potassium 3.2, creatinine 2.1, WBC 15.8, hemoglobin 9.3.  CT head negative.  EKG on admission showed sinus rhythm, 72 bpm, occasional PVCs.  Initial blood pressures noted in the 90s systolic.  He was admitted to family medicine teaching service and transfused 1 unit PRBCs. Hgb up to 9.9. Blood cultures negative x2.  Evaluated by urology. Cardiology now asked to evaluate as he has PVCs on telemetry with some persistent dizziness.   In talking with patient he does report a history of vertigo, as well as what sounds like orthostasis.  States he is feeling much improved since admission.  Still having some occasional lightheadedness whenever he gets up to ambulate to the bathroom, but this has not necessarily unusual for him. Denies any chest pain, palpitations, or syncope prior to admission.   Past Medical History:  Diagnosis Date   Back pain    Cataract    Diabetes mellitus without complication (HCC)    Hyperlipidemia     Hypertension     Past Surgical History:  Procedure Laterality Date   COLONOSCOPY  09/13/2014   COLONOSCOPY  01/12/2020   EYE SURGERY     KNEE SURGERY     left knee   MINOR CARPAL TUNNEL     POLYPECTOMY     TONSILLECTOMY      Inpatient Medications: Scheduled Meds:  sodium chloride   Intravenous Once   atorvastatin  10 mg Oral Daily   insulin aspart  0-9 Units Subcutaneous TID WC   Continuous Infusions:  PRN Meds: prochlorperazine  Allergies:   No Known Allergies  Social History:   Social History   Socioeconomic History   Marital status: Married    Spouse name: Not on file   Number of children: Not on file   Years of education: Not on file   Highest education level: Not on file  Occupational History   Not on file  Tobacco Use   Smoking status: Former    Current packs/day: 0.00    Types: Cigarettes    Quit date: 08/31/1983    Years since quitting: 39.9   Smokeless tobacco: Never  Vaping Use   Vaping status: Never Used  Substance and Sexual Activity   Alcohol use: No    Alcohol/week: 0.0 standard drinks of alcohol   Drug use: No   Sexual activity: Not on file  Other Topics Concern   Not on file  Social History Narrative  Not on file   Social Determinants of Health   Financial Resource Strain: Not on file  Food Insecurity: No Food Insecurity (08/16/2023)   Hunger Vital Sign    Worried About Running Out of Food in the Last Year: Never true    Ran Out of Food in the Last Year: Never true  Transportation Needs: No Transportation Needs (08/16/2023)   PRAPARE - Administrator, Civil Service (Medical): No    Lack of Transportation (Non-Medical): No  Physical Activity: Not on file  Stress: Not on file  Social Connections: Unknown (02/26/2022)   Received from [facility]   Social Network    Social Network: Not on file  Intimate Partner Violence: Not At Risk (08/16/2023)   Humiliation, Afraid, Rape, and Kick questionnaire     Fear of Current or Ex-Partner: No    Emotionally Abused: No    Physically Abused: No    Sexually Abused: No    Family History:    Family History  Problem Relation Age of Onset   Dementia Mother    Colon cancer Paternal Grandmother    Colon polyps Neg Hx    Esophageal cancer Neg Hx    Rectal cancer Neg Hx    Stomach cancer Neg Hx      ROS:  Please see the history of present illness.   All other ROS reviewed and negative.     Physical Exam/Data:   Vitals:   08/18/23 0930 08/18/23 0933 08/18/23 0936 08/18/23 0939  BP:      Pulse: 68 72 84 66  Resp:      Temp:      TempSrc:      SpO2:      Weight:      Height:       No intake or output data in the 24 hours ending 08/18/23 1346    08/16/2023    2:39 PM 03/19/2023   10:11 AM 03/07/2023    8:05 AM  Last 3 Weights  Weight (lbs) 228 lb 228 lb 228 lb  Weight (kg) 103.42 kg 103.42 kg 103.42 kg     Body mass index is 32.71 kg/m.  General:  Well nourished, well developed, in no acute distress HEENT: normal Neck: no JVD Vascular: No carotid bruits; Distal pulses 2+ bilaterally Cardiac:  normal S1, S2; RRR; no murmur  Lungs:  clear to auscultation bilaterally, no wheezing, rhonchi or rales  Abd: soft, nontender, no hepatomegaly  Ext: no edema Musculoskeletal:  No deformities, BUE and BLE strength normal and equal Skin: warm and dry  Neuro:  CNs 2-12 intact, no focal abnormalities noted Psych:  Normal affect   EKG:  The EKG was personally reviewed and demonstrates: sinus rhythm, 77 bpm, occ PVC Telemetry:  Telemetry was personally reviewed and demonstrates:  Sinus Rhythm, occ PVCs  Relevant CV Studies:  Echo: pending  Laboratory Data:  High Sensitivity Troponin:   Recent Labs  Lab 08/17/23 1330 08/17/23 1931  TROPONINIHS 26* 29*     Chemistry Recent Labs  Lab 08/16/23 1442 08/17/23 0345 08/17/23 1330 08/18/23 0313  NA 136 135  --  138  K 3.2* 3.4*  --  3.1*  CL 100 101  --  104  CO2 26 23  --  25   GLUCOSE 92 91  --  128*  BUN 41* 27*  --  17  CREATININE 2.12* 1.36*  --  1.18  CALCIUM 9.7 9.4  --  9.6  MG  --   --  2.1  --   GFRNONAA 32* 55*  --  >60  ANIONGAP 10 11  --  9    No results for input(s): "PROT", "ALBUMIN", "AST", "ALT", "ALKPHOS", "BILITOT" in the last 168 hours. Lipids No results for input(s): "CHOL", "TRIG", "HDL", "LABVLDL", "LDLCALC", "CHOLHDL" in the last 168 hours.  Hematology Recent Labs  Lab 08/16/23 1442 08/17/23 0345 08/17/23 1010 08/18/23 0313  WBC 15.8* 13.0*  --  10.2  RBC 2.82* 3.03*  --  3.04*  HGB 9.3* 9.9* 10.0* 9.7*  HCT 28.0* 29.1* 28.5* 28.5*  MCV 99.3 96.0  --  93.8  MCH 33.0 32.7  --  31.9  MCHC 33.2 34.0  --  34.0  RDW 12.4 14.6  --  14.2  PLT 280 258  --  270   Thyroid  Recent Labs  Lab 08/17/23 1330  TSH 1.281    BNPNo results for input(s): "BNP", "PROBNP" in the last 168 hours.  DDimer No results for input(s): "DDIMER" in the last 168 hours.   Radiology/Studies:  CT Head Wo Contrast  Result Date: 08/16/2023 CLINICAL DATA:  Ataxia, head trauma EXAM: CT HEAD WITHOUT CONTRAST TECHNIQUE: Contiguous axial images were obtained from the base of the skull through the vertex without intravenous contrast. RADIATION DOSE REDUCTION: This exam was performed according to the departmental dose-optimization program which includes automated exposure control, adjustment of the mA and/or kV according to patient size and/or use of iterative reconstruction technique. COMPARISON:  Brain MRI 01/30/2021 FINDINGS: Brain: There is no acute intracranial hemorrhage, extra-axial fluid collection, or acute infarct. Parenchymal volume is within expected limits for age. The ventricles are normal in size. Gray-white differentiation is preserved. The pituitary and suprasellar region are normal. There is no mass lesion. There is no mass effect or midline shift. Vascular: No hyperdense vessel or unexpected calcification. Skull: Normal. Negative for fracture or focal  lesion. Sinuses/Orbits: The paranasal sinuses are clear. Bilateral lens implants are in place. The globes and orbits are otherwise unremarkable. Other: The mastoid air cells and middle ear cavities are clear. IMPRESSION: No acute intracranial pathology. Electronically Signed   By: Lesia Hausen M.D.   On: 08/16/2023 17:57     Assessment and Plan:   Harold Grzelak. is a 72 y.o. male with a hx of DM, HTN, HLD who is being seen 08/18/2023 for the evaluation of dizziness/PVCs at the request of Dr. McDiarmid.  Dizziness Acute blood loss anemia S/p recent prostate biospy -- presented on 11/2 with complaints of dizziness, lightheadedness and a fall at his home.  He was found to be anemic with hemoglobin of 9.3, AKI with Cr 2 and WBC 15.8 after recently undergoing prostate biopsy. BP in the 80-90 systolic, now up to the 120s systolic. Seen by urology, no over concerns noted. Do not see FOBT was ordered.  Will defer further testing to primary -- s/p 1 unit of PRBCs with hgb up 9.9 -- BC negative to date -- CT ango abd/pelvs pending read -- suspect combination of anemia, electrolyte imbalance and dehydration  -- echo pending official read  PVCs -- noted to have occ PVCs on telemetry, though in the setting of ongoing hypokalemia -- do not suspect that burden is high enough to be the culprit of presenting symptoms. Does have baseline bradycardia, therefore would not recommend addition of BB therapy  HTN -- BPs in the 90s on admission, therefore home meds have been held -- PTA Diovan/hydrochlorothiazide, would suggest stopping hydrochlorothiazide at DC given issues with  hypokalemia  AKI -- Cr 2 on admission, improved to 1.18  Hypokalemia -- K+ 3.1, Mag 2.1 -- supplement  For questions or updates, please contact [facility] Please consult [email or web address] for contact info under    Signed, Laverda Page, NP  08/18/2023 1:46 PM

## 2023-08-18 NOTE — Progress Notes (Signed)
Daily Progress Note Intern Pager: [phone]  Patient name: Harold [name]. Medical record number: [id number] Date of birth: 1951-07-21 Age: 72 y.o. Gender: male  Primary Care Provider: Renaye Rakers, MD Consultants: Urology Code Status: Full  Pt Overview and Major Events to Date:  11/2 - Admitted Assessment and Plan:  Harold SCHIPANI Sr. is a 72 y.o. male presenting with symptomatic anemia following episode of hematochezia following prostate biopsy.  PMH significant for T2DM, HTN, HLD.  Assessment & Plan Acute blood loss anemia Patient reports no bleeding.  Hemoglobin stable ~10 s/p 2 units PRBCs.  Normocytic.  Blood pressure stable.  Urology recommends ruling out other causes. CTA abdomen pelvis pending blood cultures pending Diabetes mellitus, type II, insulin dependent (HCC) Home regimen metformin 1000 mg twice daily and Lantus 10 units daily.  A1c 4.9.  Glucose 88 this morning.  Will stop long-acting insulin.  -Stopping long-acting insulin -Holding metformin -SSI with CBGs with meals and bedtime AKI (acute kidney injury) (HCC) Baseline creatinine ~ 1. Cr trending down (2.12>1.36->1.18)  s/p fluid bolus and 2 units RBCs. -A.m. BMP Light headedness Differential includes acute blood loss anemia, atrial fibrillation, SVT, heart failure, hypoglycemia, angina, medication adverse effect.  Ruled out MI.  Frequent PVCs on telemetry and repeat EKG.  Does have history of vertigo.  Vital stable and orthostatic vitals negative and holding blood pressure medications.  Echo rules out heart failure with normal ejection fraction and only grade 1 diastolic dysfunction and no clinical symptoms of hypervolemia. - caridology consulted for recommendations on PVC burden  Hypokalemia 3.1.  Mag 2.1 - Repleted potassium chloride 60 mEq  -Recheck BMP tomorrow   Chronic and Stable Issues: HTN-holding medications due to soft pressures in setting of bleed HLD-continue Lipitor  FEN/GI:  Carb modified PPx: SCDs only due to bleeding Dispo:Home  pending ruling out causes of bleeding and determining etiology for lightheadedness including ruling out cardiac abnormalities .   Subjective:  Patient reports having lightheadedness.  He reports that is different than his vertigo he experiences.  He reports that the lightheadedness started after the procedure.  He reports that it is worsened by standing up.  He denies experiencing palpitations.  He reports no history of cardiac issues.  He reports he has never been to a cardiologist.  Objective: Temp:  [97.7 F (36.5 C)-98.9 F (37.2 C)] 97.8 F (36.6 C) (11/04 0745) Pulse Rate:  [66-87] 66 (11/04 0939) Resp:  [16-18] 16 (11/04 0745) BP: (105-128)/(66-89) 128/66 (11/04 0745) SpO2:  [95 %-100 %] 99 % (11/04 0745) Physical Exam: General: Cooperative sitting up in bed for orthostatic vitals but asks to lay back down immediately after.  Cardiovascular: Normal rate and rhythm.  Normal S1 and S2.  No murmurs rubs or gallops. Respiratory: Lungs clear to auscultation bilaterally.  Normal effort. Abdomen: Distended.  Bowel sounds present.  No abdominal tenderness. Extremities: No lower extremity edema.  No lesions.  Laboratory: Most recent CBC Lab Results  Component Value Date   WBC 10.2 08/18/2023   HGB 9.7 (L) 08/18/2023   HCT 28.5 (L) 08/18/2023   MCV 93.8 08/18/2023   PLT 270 08/18/2023   Most recent BMP    Latest Ref Rng & Units 08/18/2023    3:13 AM  BMP  Glucose 70 - 99 mg/dL 147   BUN 8 - 23 mg/dL 17   Creatinine 8.29 - 1.24 mg/dL 5.62   Sodium 130 - 865 mmol/L 138   Potassium 3.5 - 5.1 mmol/L  3.1   Chloride 98 - 111 mmol/L 104   CO2 22 - 32 mmol/L 25   Calcium 8.9 - 10.3 mg/dL 9.6     Blood cultures x2: No growth at 24 hours.  Hemoglobin A1c: 4.9  Troponins: 26, 29  TSH: 1.3  Mag: 2.1  Imaging/Diagnostic Tests: CTA abdomen pelvis: pending  2D echo: Left ventricular ejection fraction 65 to 70%.  Grade  1 diastolic function.  Mild left atrial dilation.  Mild descending aortic dilation.  [name], MD 08/18/2023, 3:50 PM  PGY-1, [facility] Family Medicine FPTS Intern pager: [phone], text pages welcome Secure chat group [facility] Teaching Service

## 2023-08-18 NOTE — Assessment & Plan Note (Addendum)
Patient reports no bleeding.  Hemoglobin stable ~10 s/p 2 units PRBCs.  Normocytic.  Blood pressure stable.  Urology recommends ruling out other causes. CTA abdomen pelvis pending blood cultures pending

## 2023-08-18 NOTE — Assessment & Plan Note (Addendum)
Differential includes acute blood loss anemia, atrial fibrillation, SVT, heart failure, hypoglycemia, angina, medication adverse effect.  Ruled out MI.  Frequent PVCs on telemetry and repeat EKG.  Does have history of vertigo.  Vital stable and orthostatic vitals negative and holding blood pressure medications.  Echo rules out heart failure with normal ejection fraction and only grade 1 diastolic dysfunction and no clinical symptoms of hypervolemia. - caridology consulted for recommendations on PVC burden

## 2023-08-19 DIAGNOSIS — N179 Acute kidney failure, unspecified: Secondary | ICD-10-CM | POA: Diagnosis not present

## 2023-08-19 DIAGNOSIS — D62 Acute posthemorrhagic anemia: Secondary | ICD-10-CM | POA: Diagnosis not present

## 2023-08-19 DIAGNOSIS — K625 Hemorrhage of anus and rectum: Secondary | ICD-10-CM | POA: Diagnosis not present

## 2023-08-19 LAB — MAGNESIUM: Magnesium: 1.7 mg/dL (ref 1.7–2.4)

## 2023-08-19 LAB — CBC
HCT: 29.2 % — ABNORMAL LOW (ref 39.0–52.0)
Hemoglobin: 10.1 g/dL — ABNORMAL LOW (ref 13.0–17.0)
MCH: 32.7 pg (ref 26.0–34.0)
MCHC: 34.6 g/dL (ref 30.0–36.0)
MCV: 94.5 fL (ref 80.0–100.0)
Platelets: 289 10*3/uL (ref 150–400)
RBC: 3.09 MIL/uL — ABNORMAL LOW (ref 4.22–5.81)
RDW: 13.9 % (ref 11.5–15.5)
WBC: 9.6 10*3/uL (ref 4.0–10.5)
nRBC: 0 % (ref 0.0–0.2)

## 2023-08-19 LAB — GLUCOSE, CAPILLARY
Glucose-Capillary: 114 mg/dL — ABNORMAL HIGH (ref 70–99)
Glucose-Capillary: 87 mg/dL (ref 70–99)

## 2023-08-19 LAB — BASIC METABOLIC PANEL
Anion gap: 6 (ref 5–15)
BUN: 13 mg/dL (ref 8–23)
CO2: 25 mmol/L (ref 22–32)
Calcium: 9.6 mg/dL (ref 8.9–10.3)
Chloride: 108 mmol/L (ref 98–111)
Creatinine, Ser: 1.11 mg/dL (ref 0.61–1.24)
GFR, Estimated: >60 mL/min (ref >60)
Glucose, Bld: 101 mg/dL — ABNORMAL HIGH (ref 70–99)
Potassium: 3.7 mmol/L (ref 3.5–5.1)
Sodium: 139 mmol/L (ref 135–145)

## 2023-08-19 NOTE — Assessment & Plan Note (Deleted)
Cardiology reports that the burden is less than 10% and normal left ventricular ejection fraction so no indication for management.  Unlikely be related to patient's lightheadedness.  Likely related to hypokalemia and acute blood loss both of which are resolved.   Avoid beta-blockers Consider spironolactone with continued PVC burden

## 2023-08-19 NOTE — Assessment & Plan Note (Deleted)
3.1->3.7.  Mag 2.1->1.7 .

## 2023-08-19 NOTE — Plan of Care (Signed)
Pt understanding of discharge instructions  

## 2023-08-19 NOTE — Plan of Care (Signed)

## 2023-08-19 NOTE — Care Management Important Message (Signed)
Important Message  Patient Details  Name: Harold PERSLEY Sr. MRN: [id number] Date of Birth: 1950-12-17   Important Message Given:  Yes - Medicare IM     [name] 08/19/2023, 1:57 PM

## 2023-08-19 NOTE — Assessment & Plan Note (Deleted)
Home regimen metformin 1000 mg twice daily and Lantus 10 units daily.  A1c 4.9.  Glucose 88 this morning.  Will stop long-acting insulin.  -Stopping long-acting insulin -Holding metformin -SSI with CBGs with meals and bedtime

## 2023-08-19 NOTE — Assessment & Plan Note (Deleted)
Patient reports no bleeding.  Hemoglobin still stable at 10.  Normocytic.  Blood pressure stable.  Appears secondary to prostate biopsy but urology recommends ruling out other causes.  CT abdomen and pelvis negative.  Blood cultures are no growth in 2 days but low concern for bacteremia, stable vitals, afebrile, low white blood count. Follow-up PCP

## 2023-08-19 NOTE — TOC Transition Note (Signed)
Transition of Care [facility]) - CM/SW Discharge Note   Patient Details  Name: Harold [name]. MRN: [id number] Date of Birth: Feb 17, 1951  Transition of Care [facility] CM/SW Contact:  Tom-Johnson, Hershal Coria, RN Phone Number: 08/19/2023, 12:49 PM   Clinical Narrative:     Patient is scheduled for discharge today.  Readmission Risk Assessment done. Outpatient f/u, hospital f/u and discharge instructions on AVS. No TOC needs or recommendations noted. Daughter, Dindi to transport at discharge.  No further TOC needs noted.         Final next level of care: Home/Self Care Barriers to Discharge: Barriers Resolved   Patient Goals and CMS Choice CMS Medicare.gov Compare Post Acute Care list provided to:: Patient Choice offered to / list presented to : NA  Discharge Placement                  Patient to be transferred to facility by: Daughter Name of family member notified: Dindi    Discharge Plan and Services Additional resources added to the After Visit Summary for                  DME Arranged: N/A DME Agency: NA       HH Arranged: NA HH Agency: NA        Social Determinants of Health (SDOH) Interventions SDOH Screenings   Food Insecurity: No Food Insecurity (08/16/2023)  Housing: Low Risk  (08/16/2023)  Transportation Needs: No Transportation Needs (08/16/2023)  Utilities: Not At Risk (08/16/2023)  Social Connections: Unknown (02/26/2022)   Received from [facility]  Tobacco Use: Medium Risk (08/16/2023)     Readmission Risk Interventions    08/19/2023   12:48 PM  Readmission Risk Prevention Plan  Post Dischage Appt Complete  Medication Screening Complete  Transportation Screening Complete

## 2023-08-19 NOTE — Evaluation (Signed)
Physical Therapy Evaluation Patient Details Name: DEMICHAEL TRAUM Sr. MRN: [id number] DOB: 12/05/1950 Today's Date: 08/19/2023  History of Present Illness  Rayhaan Huster. is a 72 y.o. male presenting with symptomatic anemia following episode of hematochezia following prostate biopsy.   PMH significant for T2DM, HTN, HLD.  Clinical Impression  Pt presents with admitting diagnosis above. Pt today was able to ambulate in hallway with no AD supervision/CGA. PTA pt was independent with no AD. Pt said he doesn't feel 100% with ambulation however expect that pt is likely close to baseline. Anticipate pt will need no follow up PT upon DC. Pt would benefit from continued mobility during acute stay.       If plan is discharge home, recommend the following: A little help with walking and/or transfers;Help with stairs or ramp for entrance;Assist for transportation   Can travel by private vehicle        Equipment Recommendations None recommended by PT  Recommendations for Other Services       Functional Status Assessment Patient has had a recent decline in their functional status and demonstrates the ability to make significant improvements in function in a reasonable and predictable amount of time.     Precautions / Restrictions Precautions Precautions: Fall Restrictions Weight Bearing Restrictions: No      Mobility  Bed Mobility Overal bed mobility: Modified Independent                  Transfers Overall transfer level: Needs assistance Equipment used: None Transfers: Sit to/from Stand Sit to Stand: Supervision           General transfer comment: Pt able to self steady.    Ambulation/Gait Ambulation/Gait assistance: Contact guard assist Gait Distance (Feet): 150 Feet Assistive device: None Gait Pattern/deviations: Wide base of support, Step-through pattern Gait velocity: decreased     General Gait Details: Pt with wide cautious slowed gait pattern. 1  seated rest break. Pt said he feels off but no LOB noted.  Stairs            Wheelchair Mobility     Tilt Bed    Modified Rankin (Stroke Patients Only)       Balance Overall balance assessment: No apparent balance deficits (not formally assessed)                                           Pertinent Vitals/Pain Pain Assessment Pain Assessment: No/denies pain    Home Living Family/patient expects to be discharged to:: Private residence Living Arrangements: Alone Available Help at Discharge: Family;Friend(s);Neighbor;Available PRN/intermittently Type of Home: House (townhouse) Home Access: Stairs to enter Entrance Stairs-Rails: Can reach both Entrance Stairs-Number of Steps: 1 Alternate Level Stairs-Number of Steps: 7 + 7 (has chair lift available) Home Layout: Multi-level Home Equipment: Rolling Walker (2 wheels);Cane - quad;Shower seat;Grab bars - tub/shower;Wheelchair - manual;BSC/3in1      Prior Function Prior Level of Function : Independent/Modified Independent;Driving;History of Falls (last six months) (Got dizzy and fell on saturday from blood loss.)             Mobility Comments: ind no AD ADLs Comments: ind     Extremity/Trunk Assessment   Upper Extremity Assessment Upper Extremity Assessment: Overall WFL for tasks assessed    Lower Extremity Assessment Lower Extremity Assessment: Overall WFL for tasks assessed    Cervical / Trunk Assessment Cervical /  Trunk Assessment: Normal  Communication   Communication Communication: No apparent difficulties  Cognition Arousal: Alert Behavior During Therapy: WFL for tasks assessed/performed Overall Cognitive Status: Within Functional Limits for tasks assessed                                          General Comments General comments (skin integrity, edema, etc.): 129/78 seated after ambulation. Pt denies dizziness.    Exercises     Assessment/Plan    PT  Assessment Patient needs continued PT services  PT Problem List Decreased strength;Decreased range of motion;Decreased activity tolerance;Decreased balance;Decreased mobility;Decreased coordination;Decreased knowledge of use of DME;Decreased safety awareness;Cardiopulmonary status limiting activity;Decreased knowledge of precautions       PT Treatment Interventions DME instruction;Gait training;Stair training;Functional mobility training;Therapeutic activities;Therapeutic exercise;Balance training;Neuromuscular re-education;Patient/family education    PT Goals (Current goals can be found in the Care Plan section)  Acute Rehab PT Goals Patient Stated Goal: to go home PT Goal Formulation: With patient Time For Goal Achievement: 09/02/23 Potential to Achieve Goals: Good    Frequency Min 1X/week     Co-evaluation               AM-PAC PT "6 Clicks" Mobility  Outcome Measure Help needed turning from your back to your side while in a flat bed without using bedrails?: None Help needed moving from lying on your back to sitting on the side of a flat bed without using bedrails?: None Help needed moving to and from a bed to a chair (including a wheelchair)?: None Help needed standing up from a chair using your arms (e.g., wheelchair or bedside chair)?: A Little Help needed to walk in hospital room?: A Little Help needed climbing 3-5 steps with a railing? : A Little 6 Click Score: 21    End of Session Equipment Utilized During Treatment: Gait belt Activity Tolerance: Patient tolerated treatment well Patient left: in bed;with call bell/phone within reach Nurse Communication: Mobility status PT Visit Diagnosis: Other abnormalities of gait and mobility (R26.89)    Time: 9562-1308 PT Time Calculation (min) (ACUTE ONLY): 21 min   Charges:   PT Evaluation $PT Eval Low Complexity: 1 Low   PT General Charges $$ ACUTE PT VISIT: 1 Visit         Shela Nevin, PT, DPT Acute Rehab  Services [phone]   Gladys Damme 08/19/2023, 1:52 PM

## 2023-08-19 NOTE — Discharge Instructions (Addendum)
Dear Harold Almond Sr.,  Thank you for letting us participate in your care. You were hospitalized for rectal bleeding and diagnosed with Acute blood loss anemia. You were treated with 2 units blood and workup for other sources of bleeding aside from prostate and none were found. For dizziness, cardiac workup was all negative. It is likely related to blood loss and orthostatic hypotension (dizziness when standing).   POST-HOSPITAL & CARE INSTRUCTIONS Follow up with Dr. Parke Simmers as soon as possible. She has access to our notes and can see our recommendations and your hospital course.  When getting up, sit up for a moment until less dizzy. If it is something urgent such as going to bathroom, use a walker or similar device. You reported that you have these available from your wife.  Stop taking your blood pressure medications and diabetes medications until you see your PCP.  Any worsening of symptoms call your doctor's office or go to emergency department.  Go to your follow up appointments (listed below)   DOCTOR'S APPOINTMENT   Scheduled follow up with Dr. Tedra Senegal office as soon as possible.    Take care and be well!  Family Medicine Teaching Service Inpatient Team [place]  [facility]  [address] [place], [place] [address] [phone]

## 2023-08-19 NOTE — Assessment & Plan Note (Deleted)
Cardiogenic causes ruled out with negative echo, <10% burden of PVCs on telemetry, and recommendations from cardiology.  Likely related to orthostatic hypotension in setting of acute blood loss. Follow-up PCP Avoid beta-blockers, consider spironolactone with continued PVC burden

## 2023-08-19 NOTE — Discharge Summary (Cosign Needed Addendum)
Family Medicine Teaching [facility] Discharge Summary  Patient name: Harold [name] Medical record number: [id number] Date of birth: 07-Aug-1951 Age: 72 y.o. Gender: [name] Date of Admission: 08/16/2023  Date of Discharge: 08/19/23 Admitting Physician: [name], MD  Primary Care Provider: Renaye Rakers, MD Consultants: Urology  Indication for Hospitalization: Hematochezia secondary to prostate biopsy  Discharge Diagnoses/Problem List:  Principal Problem for Admission: Hematochezia Other Problems addressed during stay:  Principal Problem:   Acute blood loss anemia Active Problems:   Diabetes mellitus, type II, insulin dependent (HCC)   Dizziness   PVC (premature ventricular contraction)   Rectal bleeding   Brief Hospital Course:  Harold DEMAN Sr. is a Harold [name], Harold [name], Harold [name] who was admitted to the [facility] Medicine Teaching Service at [facility] for symptomatic anemia. His hospital course is detailed below:  Acute blood loss anemia Presented with fatigue, palpitations, and a fall without LOC or head injury. Had prostate biopsy on 10/29 with significant rectal bleeding right after biopsy for 12 hours and resolved since. Hgb was 9.3 on admission (down from 16), got 2 u pRBCs and IVF bolus in the ED. CT abdomen/pelvis showed no other source of bleeding. Urology was consulted and did not recommend intervention.  No bleeding since episode.  Lightheadedness patient reports lightheadedness which began around time the blood loss episode.  Worsened by standing up but negative orthostatic vitals.  Ruled down cardiogenic causes with negative echo and telemetry which showed less than 10% burden of PVCs.  Cardiology recommended avoiding beta-blockers and recommended considering spironolactone with continued PVC burden.  PT evaluated patient and was able to ambulate without issue.  Patient was recommended to take his time when getting up and to use his spouses  walker when he rushes to use the restroom.  Blood pressure medications held her hospitalization and at discharge be restarted by PCP recommendation.  Diabetes mellitus, type II A1c 4.9.  Glucose level of 88 one morning.  D/c  insulin at discharge, continued on metformin.   Other chronic conditions were medically managed with home medications and formulary alternatives as necessary (hypertension, hyperlipidemia)  PCP Follow-up Recommendations: 3-4 week XR PA and lateral for lingula opacity incidentally found on CT Avoid Beta blockers per Cardiology recommendations Ensure Urology follow-up Consider outpatient Cardiology follow-up Restart blood pressure medications as tolerated    Disposition: Home  Discharge Condition: Stable  Discharge Exam:  Vitals:   08/19/23 0819 08/19/23 0938  BP: 135/84 133/61  Pulse: 73 72  Resp: 17   Temp: 98.6 F (37 C) 98.7 F (37.1 C)  SpO2:  99%   General: [name], well-appearing CV: RRR, normal S1/S2 Respiratory CTAB, normal effort Abdomen: Bowel sounds present, no tenderness Extremities: No BLE edema  Significant Labs and Imaging:  Recent Labs  Lab 08/18/23 0313 08/19/23 0703  WBC 10.2 9.6  HGB 9.7* 10.1*  HCT 28.5* 29.2*  PLT 270 289   Recent Labs  Lab 08/18/23 0313 08/19/23 0703  NA 138 139  K 3.1* 3.7  CL 104 108  CO2 25 25  GLUCOSE 128* 101*  BUN 17 13  CREATININE 1.18 1.11  CALCIUM 9.6 9.6  MG  --  1.7    Results/Tests Pending at Time of Discharge: None  Discharge Medications:  Allergies as of 08/19/2023   No Known Allergies      Medication List     STOP taking these medications    amLODipine 10 MG tablet Commonly known as:  NORVASC   cloNIDine 0.1 MG tablet Commonly known as: CATAPRES   insulin glargine 100 UNIT/ML injection Commonly known as: LANTUS   LORazepam 0.5 MG tablet Commonly known as: Ativan   meclizine 25 MG tablet Commonly known as: ANTIVERT   metoCLOPramide 10 MG  tablet Commonly known as: REGLAN   tamsulosin 0.4 MG Caps capsule Commonly known as: FLOMAX   valsartan-hydrochlorothiazide 320-25 MG tablet Commonly known as: DIOVAN-HCT       TAKE these medications    atorvastatin 10 MG tablet Commonly known as: LIPITOR Take 10 mg by mouth daily.   blood glucose meter kit and supplies Dispense based on patient and insurance preference. Use up to four times daily as directed. (FOR ICD-9 250.00, 250.01).   Fish Oil 1360 MG Caps Take 1 capsule by mouth daily.   MENS MULTIVITAMIN PLUS PO Take 1 capsule by mouth daily.   metFORMIN 500 MG tablet Commonly known as: GLUCOPHAGE Take 1 tablet (500 mg total) by mouth 2 (two) times daily with a meal.   ondansetron 8 MG tablet Commonly known as: ZOFRAN Take 8 mg by mouth every 8 (eight) hours as needed for nausea or vomiting.        Discharge Instructions: Please refer to Patient Instructions section of EMR for full details.  Patient was counseled important signs and symptoms that should prompt return to medical care, changes in medications, dietary instructions, activity restrictions, and follow up appointments.   Follow-Up Appointments:  Follow-up Information     Renaye Rakers, MD. Schedule an appointment as soon as possible for a visit in 3 day(s).   Specialty: Family Medicine Contact information: [address] [place] [address] [phone]                 [name] PGY-1,  Family Medicine

## 2023-08-19 NOTE — Assessment & Plan Note (Deleted)
Likely prerenal from blood loss and ATN secondary to prerenal. baseline creatinine ~ 1. Cr trending down 2.12>1.36->1.18->1.11

## 2023-08-21 DIAGNOSIS — Z794 Long term (current) use of insulin: Secondary | ICD-10-CM | POA: Diagnosis not present

## 2023-08-21 DIAGNOSIS — E119 Type 2 diabetes mellitus without complications: Secondary | ICD-10-CM | POA: Diagnosis not present

## 2023-08-21 DIAGNOSIS — E1169 Type 2 diabetes mellitus with other specified complication: Secondary | ICD-10-CM | POA: Diagnosis not present

## 2023-08-21 DIAGNOSIS — N179 Acute kidney failure, unspecified: Secondary | ICD-10-CM | POA: Diagnosis not present

## 2023-08-21 DIAGNOSIS — K922 Gastrointestinal hemorrhage, unspecified: Secondary | ICD-10-CM | POA: Diagnosis not present

## 2023-08-21 DIAGNOSIS — E876 Hypokalemia: Secondary | ICD-10-CM | POA: Diagnosis not present

## 2023-08-21 DIAGNOSIS — D62 Acute posthemorrhagic anemia: Secondary | ICD-10-CM | POA: Diagnosis not present

## 2023-08-21 LAB — CULTURE, BLOOD (ROUTINE X 2)
Culture: NO GROWTH
Culture: NO GROWTH

## 2023-09-05 DIAGNOSIS — Z20822 Contact with and (suspected) exposure to covid-19: Secondary | ICD-10-CM | POA: Diagnosis not present

## 2023-09-05 DIAGNOSIS — M130 Polyarthritis, unspecified: Secondary | ICD-10-CM | POA: Diagnosis not present

## 2023-09-05 DIAGNOSIS — D62 Acute posthemorrhagic anemia: Secondary | ICD-10-CM | POA: Diagnosis not present

## 2023-09-05 DIAGNOSIS — R82994 Hypercalciuria: Secondary | ICD-10-CM | POA: Diagnosis not present

## 2023-09-05 DIAGNOSIS — E1169 Type 2 diabetes mellitus with other specified complication: Secondary | ICD-10-CM | POA: Diagnosis not present

## 2023-10-31 DIAGNOSIS — R82994 Hypercalciuria: Secondary | ICD-10-CM | POA: Diagnosis not present

## 2023-10-31 DIAGNOSIS — Z794 Long term (current) use of insulin: Secondary | ICD-10-CM | POA: Diagnosis not present

## 2023-10-31 DIAGNOSIS — E1169 Type 2 diabetes mellitus with other specified complication: Secondary | ICD-10-CM | POA: Diagnosis not present

## 2023-10-31 DIAGNOSIS — D62 Acute posthemorrhagic anemia: Secondary | ICD-10-CM | POA: Diagnosis not present

## 2023-10-31 DIAGNOSIS — M130 Polyarthritis, unspecified: Secondary | ICD-10-CM | POA: Diagnosis not present

## 2023-10-31 DIAGNOSIS — E1165 Type 2 diabetes mellitus with hyperglycemia: Secondary | ICD-10-CM | POA: Diagnosis not present

## 2024-02-27 ENCOUNTER — Telehealth: Payer: Self-pay

## 2024-02-27 NOTE — Progress Notes (Signed)
   02/27/2024  Patient ID: Harold Pool Sr., male   DOB: June 27, 1951, 73 y.o.   MRN: [id number]  Reviewed patient regarding medication adherence from a quality report for [facility]. The patient failed [facility] in 2024.     Per [facility] and payor portal fill history: Valsartan-Hydrochlorothiazide 320-25 mg - last filled 02/15/24 for a 80-day supply.  Atorvastatin  10 mg - last filled 12/29/23 for a 80-day supply.    Jardiance prescribed in January, patient reported a > $300 copay. I screened him for medication assistance but he exceeds all the income limits for PAP through manufacturer. Explained the payment structure for [place] patients. Patient agreeable to copays at this time. Diabetes well controlled, A1c consistently < 6%. Patient also has CGM.    I will follow up for adherence monitoring.   Thank you for allowing pharmacy to be a part of this patient's care.    Livia Riffle, PharmD Clinical Pharmacist  [phone]

## 2024-04-08 DIAGNOSIS — E1165 Type 2 diabetes mellitus with hyperglycemia: Secondary | ICD-10-CM | POA: Diagnosis not present

## 2024-04-08 DIAGNOSIS — M130 Polyarthritis, unspecified: Secondary | ICD-10-CM | POA: Diagnosis not present

## 2024-04-08 DIAGNOSIS — E7800 Pure hypercholesterolemia, unspecified: Secondary | ICD-10-CM | POA: Diagnosis not present

## 2024-04-08 DIAGNOSIS — D509 Iron deficiency anemia, unspecified: Secondary | ICD-10-CM | POA: Diagnosis not present

## 2024-04-15 ENCOUNTER — Telehealth: Payer: Self-pay

## 2024-04-15 ENCOUNTER — Other Ambulatory Visit (HOSPITAL_COMMUNITY): Payer: Self-pay

## 2024-04-15 NOTE — Telephone Encounter (Signed)
 Pharmacy Patient Advocate Encounter  Insurance verification completed.   The patient is insured through [employer] claim for [employer] Currently a quantity of 30 is a 30 day supply and the co-pay is $47 .   This test claim was processed through [facility]- copay amounts may vary at other pharmacies due to pharmacy/plan contracts, or as the patient moves through the different stages of their insurance plan.
